# Patient Record
Sex: Female | Born: 1955 | Race: White | Hispanic: No | Smoking: Former smoker
Health system: Southern US, Community
[De-identification: ages and names within clinical notes are randomized; demographics above are authoritative.]

## PROBLEM LIST (undated history)

## (undated) DIAGNOSIS — E7800 Pure hypercholesterolemia, unspecified: Secondary | ICD-10-CM

## (undated) DIAGNOSIS — K760 Fatty (change of) liver, not elsewhere classified: Secondary | ICD-10-CM

## (undated) DIAGNOSIS — F419 Anxiety disorder, unspecified: Secondary | ICD-10-CM

## (undated) DIAGNOSIS — I739 Peripheral vascular disease, unspecified: Secondary | ICD-10-CM

## (undated) DIAGNOSIS — E039 Hypothyroidism, unspecified: Secondary | ICD-10-CM

## (undated) DIAGNOSIS — N183 Chronic kidney disease, stage 3 unspecified: Secondary | ICD-10-CM

## (undated) DIAGNOSIS — Z86718 Personal history of other venous thrombosis and embolism: Secondary | ICD-10-CM

## (undated) DIAGNOSIS — J449 Chronic obstructive pulmonary disease, unspecified: Secondary | ICD-10-CM

## (undated) DIAGNOSIS — M7989 Other specified soft tissue disorders: Secondary | ICD-10-CM

## (undated) DIAGNOSIS — K219 Gastro-esophageal reflux disease without esophagitis: Secondary | ICD-10-CM

## (undated) DIAGNOSIS — G43909 Migraine, unspecified, not intractable, without status migrainosus: Secondary | ICD-10-CM

## (undated) DIAGNOSIS — M797 Fibromyalgia: Secondary | ICD-10-CM

## (undated) DIAGNOSIS — J45909 Unspecified asthma, uncomplicated: Secondary | ICD-10-CM

## (undated) DIAGNOSIS — G9332 Myalgic encephalomyelitis/chronic fatigue syndrome: Secondary | ICD-10-CM

## (undated) DIAGNOSIS — R0602 Shortness of breath: Secondary | ICD-10-CM

## (undated) DIAGNOSIS — T7840XA Allergy, unspecified, initial encounter: Secondary | ICD-10-CM

## (undated) DIAGNOSIS — M2550 Pain in unspecified joint: Secondary | ICD-10-CM

## (undated) DIAGNOSIS — R7303 Prediabetes: Secondary | ICD-10-CM

## (undated) DIAGNOSIS — I10 Essential (primary) hypertension: Secondary | ICD-10-CM

## (undated) DIAGNOSIS — K5900 Constipation, unspecified: Secondary | ICD-10-CM

## (undated) DIAGNOSIS — E669 Obesity, unspecified: Secondary | ICD-10-CM

## (undated) HISTORY — DX: Chronic kidney disease, stage 3 unspecified: N18.30

## (undated) HISTORY — DX: Hypothyroidism, unspecified: E03.9

## (undated) HISTORY — DX: Shortness of breath: R06.02

## (undated) HISTORY — DX: Peripheral vascular disease, unspecified: I73.9

## (undated) HISTORY — DX: Anxiety disorder, unspecified: F41.9

## (undated) HISTORY — DX: Personal history of other venous thrombosis and embolism: Z86.718

## (undated) HISTORY — DX: Pain in unspecified joint: M25.50

## (undated) HISTORY — DX: Migraine, unspecified, not intractable, without status migrainosus: G43.909

## (undated) HISTORY — DX: Chronic obstructive pulmonary disease, unspecified: J44.9

## (undated) HISTORY — DX: Unspecified asthma, uncomplicated: J45.909

## (undated) HISTORY — DX: Obesity, unspecified: E66.9

## (undated) HISTORY — DX: Pure hypercholesterolemia, unspecified: E78.00

## (undated) HISTORY — DX: Fibromyalgia: M79.7

## (undated) HISTORY — DX: Fatty (change of) liver, not elsewhere classified: K76.0

## (undated) HISTORY — DX: Prediabetes: R73.03

## (undated) HISTORY — DX: Gastro-esophageal reflux disease without esophagitis: K21.9

## (undated) HISTORY — DX: Other specified soft tissue disorders: M79.89

## (undated) HISTORY — DX: Myalgic encephalomyelitis/chronic fatigue syndrome: G93.32

## (undated) HISTORY — DX: Allergy, unspecified, initial encounter: T78.40XA

## (undated) HISTORY — DX: Essential (primary) hypertension: I10

## (undated) HISTORY — DX: Constipation, unspecified: K59.00

---

## 1989-12-23 HISTORY — PX: OTHER SURGICAL HISTORY: SHX169

## 2007-12-24 HISTORY — PX: WRIST SURGERY: SHX841

## 2019-12-30 ENCOUNTER — Other Ambulatory Visit (HOSPITAL_COMMUNITY): Payer: Self-pay | Admitting: Family

## 2019-12-30 ENCOUNTER — Other Ambulatory Visit: Payer: Self-pay | Admitting: Family

## 2019-12-30 DIAGNOSIS — G35 Multiple sclerosis: Secondary | ICD-10-CM

## 2020-01-11 ENCOUNTER — Ambulatory Visit (HOSPITAL_COMMUNITY): Payer: Medicare HMO

## 2020-01-11 ENCOUNTER — Encounter (HOSPITAL_COMMUNITY): Payer: Self-pay

## 2020-01-20 ENCOUNTER — Ambulatory Visit: Payer: Medicare HMO | Admitting: Neurology

## 2020-01-26 ENCOUNTER — Other Ambulatory Visit: Payer: Self-pay | Admitting: Family

## 2020-01-26 ENCOUNTER — Other Ambulatory Visit: Payer: Self-pay

## 2020-01-26 ENCOUNTER — Ambulatory Visit
Admission: RE | Admit: 2020-01-26 | Discharge: 2020-01-26 | Disposition: A | Discharge status: Home / Self Care | Payer: Medicare HMO | Source: Ambulatory Visit | Attending: Family | Admitting: Family

## 2020-01-26 DIAGNOSIS — R52 Pain, unspecified: Secondary | ICD-10-CM

## 2020-08-02 ENCOUNTER — Other Ambulatory Visit: Payer: Self-pay | Admitting: Internal Medicine

## 2020-08-02 DIAGNOSIS — F17210 Nicotine dependence, cigarettes, uncomplicated: Secondary | ICD-10-CM

## 2020-08-15 ENCOUNTER — Other Ambulatory Visit: Payer: Self-pay

## 2020-08-15 ENCOUNTER — Ambulatory Visit
Admission: RE | Admit: 2020-08-15 | Discharge: 2020-08-15 | Disposition: A | Discharge status: Home / Self Care | Payer: Medicare Other | Source: Ambulatory Visit | Attending: Internal Medicine | Admitting: Internal Medicine

## 2020-08-15 DIAGNOSIS — F17210 Nicotine dependence, cigarettes, uncomplicated: Secondary | ICD-10-CM

## 2020-08-22 ENCOUNTER — Other Ambulatory Visit: Payer: Self-pay | Admitting: Internal Medicine

## 2020-08-22 DIAGNOSIS — Z1231 Encounter for screening mammogram for malignant neoplasm of breast: Secondary | ICD-10-CM

## 2020-08-22 DIAGNOSIS — G35 Multiple sclerosis: Secondary | ICD-10-CM

## 2020-08-23 ENCOUNTER — Ambulatory Visit: Payer: Self-pay

## 2020-08-23 ENCOUNTER — Encounter: Payer: Self-pay | Admitting: Orthopaedic Surgery

## 2020-08-23 ENCOUNTER — Ambulatory Visit (INDEPENDENT_AMBULATORY_CARE_PROVIDER_SITE_OTHER): Payer: Medicare Other | Admitting: Orthopaedic Surgery

## 2020-08-23 VITALS — BP 117/70 | HR 66 | Ht 65.0 in | Wt 194.0 lb

## 2020-08-23 DIAGNOSIS — M545 Low back pain: Secondary | ICD-10-CM | POA: Diagnosis not present

## 2020-08-23 DIAGNOSIS — M549 Dorsalgia, unspecified: Secondary | ICD-10-CM | POA: Diagnosis not present

## 2020-08-23 DIAGNOSIS — M7062 Trochanteric bursitis, left hip: Secondary | ICD-10-CM | POA: Diagnosis not present

## 2020-08-23 DIAGNOSIS — G8929 Other chronic pain: Secondary | ICD-10-CM | POA: Diagnosis not present
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
## 2020-08-29 MED ORDER — METHYLPREDNISOLONE ACETATE 40 MG/ML IJ SUSP
40.0000 mg | INTRAMUSCULAR | Status: AC | PRN
  Administered 2020-08-23: 40 mg via INTRA_ARTICULAR

## 2020-08-29 MED ORDER — BUPIVACAINE HCL 0.25 % IJ SOLN
2.0000 mL | INTRAMUSCULAR | Status: AC | PRN
  Administered 2020-08-23: 2 mL via INTRA_ARTICULAR

## 2020-08-29 MED ORDER — LIDOCAINE HCL 1 % IJ SOLN
0.5000 mL | INTRAMUSCULAR | Status: AC | PRN
  Administered 2020-08-23: .5 mL

## 2020-09-01 ENCOUNTER — Ambulatory Visit: Payer: Medicare Other

## 2020-09-11 ENCOUNTER — Ambulatory Visit
Admission: RE | Admit: 2020-09-11 | Discharge: 2020-09-11 | Disposition: A | Discharge status: Home / Self Care | Payer: Medicare Other | Source: Ambulatory Visit | Attending: Internal Medicine | Admitting: Internal Medicine

## 2020-09-11 ENCOUNTER — Other Ambulatory Visit: Payer: Self-pay | Admitting: Internal Medicine

## 2020-09-11 DIAGNOSIS — G35 Multiple sclerosis: Secondary | ICD-10-CM

## 2020-09-11 MED ORDER — GADOBENATE DIMEGLUMINE 529 MG/ML IV SOLN
19.0000 mL | Freq: Once | INTRAVENOUS | Status: AC | PRN
  Administered 2020-09-11: 19 mL via INTRAVENOUS

## 2020-09-30 ENCOUNTER — Ambulatory Visit
Admission: RE | Admit: 2020-09-30 | Discharge: 2020-09-30 | Disposition: A | Discharge status: Home / Self Care | Payer: Medicare Other | Source: Ambulatory Visit | Attending: Internal Medicine | Admitting: Internal Medicine

## 2020-09-30 DIAGNOSIS — G35 Multiple sclerosis: Secondary | ICD-10-CM

## 2020-09-30 MED ORDER — GADOBENATE DIMEGLUMINE 529 MG/ML IV SOLN
20.0000 mL | Freq: Once | INTRAVENOUS | Status: AC | PRN
  Administered 2020-09-30: 20 mL via INTRAVENOUS

## 2020-12-11 ENCOUNTER — Ambulatory Visit (INDEPENDENT_AMBULATORY_CARE_PROVIDER_SITE_OTHER): Payer: Medicare Other | Admitting: Neurology

## 2020-12-11 ENCOUNTER — Encounter: Payer: Self-pay | Admitting: Neurology

## 2020-12-11 VITALS — BP 159/73 | HR 61 | Ht 65.0 in | Wt 208.0 lb

## 2020-12-11 DIAGNOSIS — I10 Essential (primary) hypertension: Secondary | ICD-10-CM

## 2020-12-11 DIAGNOSIS — R9082 White matter disease, unspecified: Secondary | ICD-10-CM

## 2020-12-11 DIAGNOSIS — R208 Other disturbances of skin sensation: Secondary | ICD-10-CM | POA: Diagnosis not present

## 2020-12-11 DIAGNOSIS — Z720 Tobacco use: Secondary | ICD-10-CM

## 2020-12-11 MED ORDER — GABAPENTIN 600 MG PO TABS
600.0000 mg | ORAL_TABLET | Freq: Three times a day (TID) | ORAL | 11 refills | Status: DC
Start: 2020-12-11 — End: 2021-12-18
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
## 2021-01-02 ENCOUNTER — Telehealth: Payer: Self-pay | Admitting: *Deleted

## 2021-01-02 NOTE — Telephone Encounter (Signed)
R/c 3 cd from [facility] cd on [place] desk

## 2021-01-03 NOTE — Progress Notes (Signed)
Patient referred by Sonia Side., FNP for coronary artery disease  Subjective:   Monica Park, female    DOB: [birth year], 65 y.o.   MRN: [id number]   Chief Complaint  Patient presents with  . Coronary Artery Disease    New pat    . New Patient (Initial Visit)     HPI  65 y.o. Caucasian female with hypertension, hyperlipidemia, hypothyroidism, multiple sclerosis, referred for management of CAD.  Three vessel coronary atherosclerosis was noted on lung cancer screening CT chest in 07/2020. She moved from [place] to be close to her ailing stepfather, in 2020. She has minimal mobility limitations from a probable diagnosis of multiple sclerosis. She walks 5,000-10,000 steps/day. She denies chest pain, shortness of breath, palpitations, leg edema, orthopnea, PND, TIA/syncope. She does endorse pain in her left calf on walking, gets better with rest.   She is currently smoking 6-8 cigarettes/day, down from 1 pack/day. She is hoping to quit soon on her own.   Past Medical History:  Diagnosis Date  . Anxiety   . Hypertension   . Migraine      Past Surgical History:  Procedure Laterality Date  . [place]  . OTHER SURGICAL HISTORY  1991   nerve block, right leg  . WRIST SURGERY Left 2009   left arm/wrist     Social History   Tobacco Use  Smoking Status Current Every Day Smoker  . Packs/day: 0.50  Smokeless Tobacco Never Used    Social History   Substance and Sexual Activity  Alcohol Use Not Currently     Family History  Problem Relation Age of Onset  . Parkinson's disease Mother   . Cervical cancer Mother   . Dementia Father   . Lung disease Father   . Rheum arthritis Sister   . Thyroid cancer Sister   . Sarcoidosis Sister   . Heart Problems Brother      Current Outpatient Medications on File Prior to Visit  Medication Sig Dispense Refill  . albuterol (VENTOLIN HFA) 108 (90 Base) MCG/ACT inhaler Inhale 2 puffs into the  lungs every 4 (four) hours.    Marland Kitchen BIOTIN PO Take 10,000 mcg by mouth daily.    . budesonide-formoterol (SYMBICORT) 160-4.5 MCG/ACT inhaler Inhale 2 puffs into the lungs 2 (two) times daily.    . cetirizine (ZYRTEC) 10 MG tablet Take 10 mg by mouth daily.    . Cholecalciferol (VITAMIN D3 PO) Take 50,000 Units by mouth once a week.    . cyclobenzaprine (FLEXERIL) 10 MG tablet Take 10 mg by mouth 3 (three) times daily. (Patient not taking: Reported on 12/11/2020)    . gabapentin (NEURONTIN) 600 MG tablet Take 1 tablet (600 mg total) by mouth 3 (three) times daily. 90 tablet 11  . L-FORMULA LYSINE HCL PO Take 1,000 mg by mouth as needed.    Marland Kitchen levothyroxine (SYNTHROID) 25 MCG tablet Take 25 mcg by mouth daily.    Marland Kitchen losartan (COZAAR) 50 MG tablet Take 50 mg by mouth daily.    . montelukast (SINGULAIR) 10 MG tablet Take 10 mg by mouth daily.    Marland Kitchen oxyCODONE-acetaminophen (PERCOCET) 10-325 MG tablet SMARTSIG:1 Tablet(s) By Mouth Every 5-6 Hours PRN (Patient not taking: Reported on 12/11/2020)    . pantoprazole (PROTONIX) 40 MG tablet Take 40 mg by mouth daily.    . Probiotic Product (HEALTHY COLON PO) Take 1 Dose by mouth daily.     No  current facility-administered medications on file prior to visit.    Cardiovascular and other pertinent studies:  EKG 01/04/2021: Sinus rhythm 56 bpm Cannot exclude old anteroseptal infarct  CT Chest 08/15/2020: 1. Lung-RADS 2, benign appearance or behavior. Continue annual screening with low-dose chest CT without contrast in 12 months. 2. Three-vessel coronary atherosclerosis. 3. Aortic Atherosclerosis (ICD10-I70.0) and Emphysema (ICD10-J43.9).  Recent labs: 07/27/2020, 11/03/2020 Glucose 83, BUN/Cr 16/1.25. EGFR 45. Na/K 142/5.2 . Rest of the CMP normal H/H 12.9/39.7. MCV 95.4. Platelets 182 HbA1C N/A Chol 241, TG 123, HDL 51, LDL 165 TSH 1.79 normal   Review of Systems  Cardiovascular: Positive for claudication and dyspnea on exertion (Mild, stable).  Negative for chest pain, leg swelling, palpitations and syncope.         Vitals:   01/04/21 1049  BP: 140/64  Pulse: (!) 52  Resp: 16  Temp: 98.2 F (36.8 C)  SpO2: 96%     Body mass index is 34.28 kg/m. Filed Weights   01/04/21 1049  Weight: 206 lb (93.4 kg)     Objective:   Physical Exam Vitals and nursing note reviewed.  Constitutional:      General: She is not in acute distress. Neck:     Vascular: No JVD.  Cardiovascular:     Rate and Rhythm: Normal rate and regular rhythm.     Pulses:          Radial pulses are 2+ on the right side and 2+ on the left side.       Femoral pulses are 1+ on the right side and 0 on the left side.      Dorsalis pedis pulses are 1+ on the right side and 0 on the left side.       Posterior tibial pulses are 1+ on the right side and 0 on the left side.     Heart sounds: Normal heart sounds. No murmur heard.   Pulmonary:     Effort: Pulmonary effort is normal.     Breath sounds: Normal breath sounds. No wheezing or rales.         Assessment & Recommendations:   65 y.o. Caucasian female with hypertension, hyperlipidemia, hypothyroidism, multiple sclerosis, CAD, PAD  1. Coronary artery disease involving native coronary artery of native heart without angina pectoris Seen on CT chest 07/2020 No angina symptoms. Mild exertional dyspnea likely related to tobacco dependence. Recommend risk factor modification. Continue Aspirin 81 mg. Did not tolerate atorvastatin before. Recommend rosuvastatin 20 mg daily.  Discussed heart healthy diet and lifestyle.   2. PAD (peripheral artery disease) (HCC) Non-limiting claudication in left leg. Suspect Left SFA disease. Continue risk factor modification, as above. Added Pletal 50 mg bid Encouraged regular walking.  3. Mixed hyperlipidemia As above Repeat lipid panel in 3 months  F/u in 3 months   Thank you for referring the patient to [place]. Please feel free to contact with any  questions.   Nigel Mormon, MD Pager: [phone] Office: [phone]

## 2021-01-04 ENCOUNTER — Other Ambulatory Visit: Payer: Self-pay

## 2021-01-04 ENCOUNTER — Ambulatory Visit: Payer: Medicare Other | Admitting: Cardiology

## 2021-01-04 ENCOUNTER — Encounter: Payer: Self-pay | Admitting: Cardiology

## 2021-01-04 VITALS — BP 140/64 | HR 52 | Temp 98.2°F | Resp 16 | Ht 65.0 in | Wt 206.0 lb

## 2021-01-04 DIAGNOSIS — I2510 Atherosclerotic heart disease of native coronary artery without angina pectoris: Secondary | ICD-10-CM

## 2021-01-04 DIAGNOSIS — E782 Mixed hyperlipidemia: Secondary | ICD-10-CM

## 2021-01-04 DIAGNOSIS — I739 Peripheral vascular disease, unspecified: Secondary | ICD-10-CM | POA: Insufficient documentation

## 2021-01-04 MED ORDER — ROSUVASTATIN CALCIUM 20 MG PO TABS: 20.0000 mg | ORAL_TABLET | Freq: Every day | ORAL | 3 refills | Status: DC

## 2021-01-04 MED ORDER — CILOSTAZOL 50 MG PO TABS: 50.0000 mg | ORAL_TABLET | Freq: Two times a day (BID) | ORAL | 3 refills | Status: DC

## 2021-01-11 ENCOUNTER — Other Ambulatory Visit: Payer: Self-pay | Admitting: Cardiology

## 2021-01-11 DIAGNOSIS — I739 Peripheral vascular disease, unspecified: Secondary | ICD-10-CM

## 2021-01-11 DIAGNOSIS — I2510 Atherosclerotic heart disease of native coronary artery without angina pectoris: Secondary | ICD-10-CM

## 2021-02-07 ENCOUNTER — Ambulatory Visit: Payer: Medicare Other

## 2021-02-07 ENCOUNTER — Other Ambulatory Visit: Payer: Self-pay

## 2021-02-07 DIAGNOSIS — I739 Peripheral vascular disease, unspecified: Secondary | ICD-10-CM

## 2021-02-07 DIAGNOSIS — I2510 Atherosclerotic heart disease of native coronary artery without angina pectoris: Secondary | ICD-10-CM

## 2021-03-15 ENCOUNTER — Other Ambulatory Visit: Payer: Self-pay | Admitting: Cardiology

## 2021-03-15 DIAGNOSIS — R0989 Other specified symptoms and signs involving the circulatory and respiratory systems: Secondary | ICD-10-CM

## 2021-03-27 ENCOUNTER — Other Ambulatory Visit: Payer: Self-pay

## 2021-03-27 ENCOUNTER — Ambulatory Visit: Payer: Medicare Other

## 2021-03-27 DIAGNOSIS — I6523 Occlusion and stenosis of bilateral carotid arteries: Secondary | ICD-10-CM

## 2021-03-27 DIAGNOSIS — R0989 Other specified symptoms and signs involving the circulatory and respiratory systems: Secondary | ICD-10-CM

## 2021-04-01 ENCOUNTER — Other Ambulatory Visit: Payer: Self-pay | Admitting: Cardiology

## 2021-04-01 DIAGNOSIS — I739 Peripheral vascular disease, unspecified: Secondary | ICD-10-CM

## 2021-04-04 ENCOUNTER — Ambulatory Visit: Payer: Medicare Other | Admitting: Cardiology

## 2021-04-04 ENCOUNTER — Encounter: Payer: Self-pay | Admitting: Cardiology

## 2021-04-04 ENCOUNTER — Other Ambulatory Visit: Payer: Self-pay

## 2021-04-04 VITALS — BP 104/51 | HR 83 | Temp 98.0°F | Resp 16 | Ht 65.0 in | Wt 212.0 lb

## 2021-04-04 DIAGNOSIS — F17210 Nicotine dependence, cigarettes, uncomplicated: Secondary | ICD-10-CM | POA: Insufficient documentation

## 2021-04-04 DIAGNOSIS — I2510 Atherosclerotic heart disease of native coronary artery without angina pectoris: Secondary | ICD-10-CM

## 2021-04-04 DIAGNOSIS — I6523 Occlusion and stenosis of bilateral carotid arteries: Secondary | ICD-10-CM

## 2021-04-04 DIAGNOSIS — E782 Mixed hyperlipidemia: Secondary | ICD-10-CM

## 2021-04-04 DIAGNOSIS — R0989 Other specified symptoms and signs involving the circulatory and respiratory systems: Secondary | ICD-10-CM | POA: Insufficient documentation

## 2021-04-04 DIAGNOSIS — I739 Peripheral vascular disease, unspecified: Secondary | ICD-10-CM

## 2021-04-04 MED ORDER — ATORVASTATIN CALCIUM 20 MG PO TABS: 20.0000 mg | ORAL_TABLET | Freq: Every day | ORAL | 3 refills | Status: DC

## 2021-04-04 NOTE — Progress Notes (Signed)
Patient referred by Dr. Jilda Panda for coronary artery disease, carotid bruit  Subjective:   Monica Park, female    DOB: [birth year], 65 y.o.   MRN: [id number]   Chief Complaint  Patient presents with  . Coronary Artery Disease  . PAD  . Follow-up    3 month  . Results    duplex     HPI  66 y.o. Caucasian female with hypertension, hyperlipidemia, hypothyroidism, multiple sclerosis, referred for management of CAD, carotid bruit  Reviewed recent test results with the patient, deails below. Patients claudication symptoms have improved on cilostazol. She had R>L leg cramps with rosuvastatin.   Initial consultation HPI 02/2021: Three vessel coronary atherosclerosis was noted on lung cancer screening CT chest in 07/2020. She moved from [place] to be close to her ailing stepfather, in 2020. She has minimal mobility limitations from a probable diagnosis of multiple sclerosis. She walks 5,000-10,000 steps/day. She denies chest pain, shortness of breath, palpitations, leg edema, orthopnea, PND, TIA/syncope. She does endorse pain in her left calf on walking, gets better with rest.   She is currently smoking 6-8 cigarettes/day, down from 1 pack/day. She is hoping to quit soon on her own.     Current Outpatient Medications on File Prior to Visit  Medication Sig Dispense Refill  . albuterol (VENTOLIN HFA) 108 (90 Base) MCG/ACT inhaler Inhale 2 puffs into the lungs every 4 (four) hours.    Marland Kitchen BIOTIN PO Take 10,000 mcg by mouth daily.    . budesonide-formoterol (SYMBICORT) 160-4.5 MCG/ACT inhaler Inhale 2 puffs into the lungs 2 (two) times daily.    . cetirizine (ZYRTEC) 10 MG tablet Take 10 mg by mouth daily.    . Cholecalciferol (VITAMIN D3 PO) Take 50,000 Units by mouth once a week.    . cilostazol (PLETAL) 50 MG tablet TAKE 1 TABLET(50 MG) BY MOUTH TWICE DAILY 180 tablet 0  . gabapentin (NEURONTIN) 600 MG tablet Take 1 tablet (600 mg total) by mouth 3 (three) times daily. 90 tablet 11   . levothyroxine (SYNTHROID) 25 MCG tablet Take 25 mcg by mouth daily.    Marland Kitchen losartan (COZAAR) 50 MG tablet Take 50 mg by mouth daily.    . montelukast (SINGULAIR) 10 MG tablet Take 10 mg by mouth daily.    . pantoprazole (PROTONIX) 40 MG tablet Take 40 mg by mouth daily.    . Probiotic Product (HEALTHY COLON PO) Take 1 Dose by mouth daily.     No current facility-administered medications on file prior to visit.    Cardiovascular and other pertinent studies:  Carotid duplex 03/27/2021 (Preliminary report): Doppler velocity suggests stenosis in the right internal carotid artery (50-69%). Doppler velocity suggests stenosis in the left internal carotid artery (50-69%). Doppler velocity suggests stenosis in the left external carotid artery (<50%). Antegrade right vertebral artery flow. Antegrade left vertebral artery flow. Follow up in six months is appropriate if clinically indicated.   ABI 02/07/2021:  This exam reveals mildly decreased perfusion of the right lower extremity,  noted at the post tibial artery level (ABI 0.92) and moderately decreased  perfusion of the left lower extremity, noted at the anterior tibial and  post tibial artery level (ABI 0.62).  Consider complete lower extremity arterial duplex if clinically indicated.   Echocardiogram 02/07/2021:  1. Normal LV systolic function with visual EF 55-60%. Left ventricle  cavity is normal in size. Mild left ventricular hypertrophy. Normal global  wall motion. Indeterminate diastolic filling pattern, normal LAP.  2. Left atrial cavity is mildly dilated.  3. Mild (Grade I) mitral regurgitation.  4. IVC is dilated with respiratory variation.  5. No prior study for comparison.  EKG 01/04/2021: Sinus rhythm 56 bpm Cannot exclude old anteroseptal infarct  CT Chest 08/15/2020: 1. Lung-RADS 2, benign appearance or behavior. Continue annual screening with low-dose chest CT without contrast in 12 months. 2. Three-vessel coronary  atherosclerosis. 3. Aortic Atherosclerosis (ICD10-I70.0) and Emphysema (ICD10-J43.9).  Recent labs: 07/27/2020, 11/03/2020 Glucose 83, BUN/Cr 16/1.25. EGFR 45. Na/K 142/5.2 . Rest of the CMP normal H/H 12.9/39.7. MCV 95.4. Platelets 182 HbA1C N/A Chol 241, TG 123, HDL 51, LDL 165 TSH 1.79 normal   Review of Systems  Cardiovascular: Positive for claudication and dyspnea on exertion (Mild, stable). Negative for chest pain, leg swelling, palpitations and syncope.         Vitals:   04/04/21 1355  BP: (!) 104/51  Pulse: 83  Resp: 16  Temp: 98 F (36.7 C)  SpO2: 98%     Body mass index is 35.28 kg/m. Filed Weights   04/04/21 1355  Weight: 212 lb (96.2 kg)     Objective:   Physical Exam Vitals and nursing note reviewed.  Constitutional:      General: She is not in acute distress. Neck:     Vascular: No JVD.  Cardiovascular:     Rate and Rhythm: Normal rate and regular rhythm.     Pulses:          Carotid pulses are on the right side with bruit and on the left side with bruit.      Radial pulses are 2+ on the right side and 2+ on the left side.       Femoral pulses are 1+ on the right side and 0 on the left side.      Dorsalis pedis pulses are 1+ on the right side and 0 on the left side.       Posterior tibial pulses are 1+ on the right side and 0 on the left side.     Heart sounds: Normal heart sounds. No murmur heard.   Pulmonary:     Effort: Pulmonary effort is normal.     Breath sounds: Normal breath sounds. No wheezing or rales.         Assessment & Recommendations:   65 y.o. Caucasian female with hypertension, hyperlipidemia, hypothyroidism, multiple sclerosis, CAD, PAD, b/l mod carotid stenosis  Coronary artery disease involving native coronary artery of native heart without angina pectoris Seen on CT chest 07/2020 No angina symptoms. Mild exertional dyspnea likely related to tobacco dependence. Recommend risk factor modification. Continue  Aspirin 81 mg. Did not tolerate atorvastatin before. Did not tolerate rosuvastatin 20 mg either, due to R?L leg cramps (PAD is worse in left leg). Not wanting to try PCSK9 inhibitor, but willing to try Lipitor 20 mg again. Repeat lipid panel in 1 month Discussed heart healthy diet and lifestyle.   PAD (peripheral artery disease) (HCC) Rt ABI 0.82, Lt ABI 0.62. Claudication improved. Cotinue risk factor modification Encouraged regular walking.  Carotid artery disease: Asymptomatic, moderate, bilateral Repeat in 6 months  Mixed hyperlipidemia As above  Nicotine dependence: Down to 10 cigarettes from 2 packs/day. Re-emphasized importance of cessation. Wants to quit on her own.  F/u in 4 weeks    Nigel Mormon, MD Pager: [phone] Office: [phone]

## 2021-04-11 ENCOUNTER — Ambulatory Visit: Payer: Medicare Other | Admitting: Neurology

## 2021-05-03 ENCOUNTER — Telehealth: Payer: Self-pay | Admitting: Neurology

## 2021-05-03 ENCOUNTER — Ambulatory Visit (INDEPENDENT_AMBULATORY_CARE_PROVIDER_SITE_OTHER): Payer: Medicare Other | Admitting: Neurology

## 2021-05-03 ENCOUNTER — Encounter: Payer: Self-pay | Admitting: Neurology

## 2021-05-03 VITALS — BP 155/73 | HR 73 | Ht 65.0 in | Wt 213.0 lb

## 2021-05-03 DIAGNOSIS — R9082 White matter disease, unspecified: Secondary | ICD-10-CM | POA: Diagnosis not present

## 2021-05-03 DIAGNOSIS — Z720 Tobacco use: Secondary | ICD-10-CM

## 2021-05-03 DIAGNOSIS — M4807 Spinal stenosis, lumbosacral region: Secondary | ICD-10-CM

## 2021-05-03 DIAGNOSIS — M5416 Radiculopathy, lumbar region: Secondary | ICD-10-CM

## 2021-05-03 DIAGNOSIS — I10 Essential (primary) hypertension: Secondary | ICD-10-CM

## 2021-05-03 DIAGNOSIS — R208 Other disturbances of skin sensation: Secondary | ICD-10-CM | POA: Diagnosis not present

## 2021-05-03 MED ORDER — BACLOFEN 10 MG PO TABS: ORAL_TABLET | ORAL | 5 refills | Status: DC

## 2021-05-03 NOTE — Progress Notes (Signed)
[facility]  PATIENT: Monica Park DOB: May 04, 1956  REFERRING DOCTOR OR PCP: Ralene Ok, MD SOURCE: Patient, notes from primary care, imaging and laboratory reports, MRI images personally reviewed.  _________________________________   HISTORICAL  CHIEF COMPLAINT:  Chief Complaint  Patient presents with  . Follow-up    RM 13, alone. Last seen 12/11/2020. Doing well, no new sx.    HISTORY OF PRESENT ILLNESS:  Monica Park is a 65 y.o. woman with leg pain, dysesthesias and the possibility of multiple sclerosis.  Update 05/03/2021: She reports burning dysesthesias in her feet and sciatic type pain in her legs.  She has a little back pain.   When she walks longer distance he gets numbness in her feet..   Pain started many years ago and has progressively worsened.   Gabapentin 600 mg po bid helps her some  If she walks a few minutes but then needs to stop due to the pain.    Due to her renal disease, we can use an NSAID.  She has no recent falls since the last visit..   She has urinary urgency and frequency with no incontinence.   Vision is usually fine but is worse when she has a headache   We had a conversation about the MRI images that I personally reviewed today and compared to previous images and possibility of MS.  I feel there is about a 25% possibility.  CSF analysis could help [place] evaluate but she is reluctant to do a lumbar puncture.  She has not had any classic exacerbations.  She sleeps well at night unless the stiffness is worse in her legs.  She sometimes feels fatigue and sometimes needs to force herself to do tasks. .She has some anxiety but not depression.  She has a lot of stress with her dad about to enter hospice (metastatic lung cancer).   She feels cognition is fine.       HISTORY:  She was diagnosed with MS in [place] at age 16 based on her symptoms and MRI.   She never had a lumbar puncture (and does not wan one).   At the time she had  stiffness in her legs and memory issues.    She was also experiencing headaches.    She was not placed on any DMTs.    Her main problem is leg pain . Tylenol helps a little bit.    Baclofen in the past helped more than flexeril.     Vascular risk factors:   She has been on medications for a few years.   She smokes 1/2 ppd now but used to smoke 2 ppd.     In 2007, she had an accident with LOC and was told she injured her brain.  She feels she lost old memories as well as more trouble forming new memory.     She has stage 3 CRF.        DATA REVIEWED: MRI of the brain 09/30/2020 shows multiple T2/FLAIR hyperintense foci in the white matter of both hemispheres.  There are no foci in the infratentorial white matter.  Most of th lesions are in the deep white matter.  Some foci are subcortical but there do not appear to be juxtacortical foci.  There are a few lesions that are in the periventricular white matter, radially oriented to the ventricles.  None of the foci enhance after contrast.  I compared to the 2015 MRI.  The overall pattern is similar though there  has been some progression with enlargement of multiple foci more consistent with chronic microvascular ischemic change.  There are a couple new foci that are small.  MRI of the cervical spine 09/11/2020 shows disc protrusions at C3-C4 through C6-C7 but no nerve root compression or spinal cord appears normal.  Normal enhancement pattern.  MRI of the lumbar spine 02/05/2008 showed minimal disc bulging at L4-L5 that does not cause spinal stenosis or nerve root compression.  At L5-S1, there is a small disc bulge, disc degradation and right greater than left facet hypertrophy.  There is no significant foraminal narrowing and only minimal lateral recess stenosis.  No nerve root compression or spinal stenosis.   MRI of the brain 10/19/2014 showed multiple T2/FLAIR hyperintense foci.  The pattern is mostly nonspecific with a few foci in the periventricular  white matter but most foci in the deep white matter or subcortical white matter. Recent lab work shows elevated creatinine with reduced GFR, normal blood count, normal lipid panel.  EKG showed bradycardia with a rate of 47.  REVIEW OF SYSTEMS: Constitutional: No fevers, chills, sweats, or change in appetite Eyes: No visual changes, double vision, eye pain Ear, nose and throat: No hearing loss, ear pain, nasal congestion, sore throat Cardiovascular: No chest pain, palpitations Respiratory: No shortness of breath at rest or with exertion.   No wheezes GastrointestinaI: No nausea, vomiting, diarrhea, abdominal pain, fecal incontinence Genitourinary: No dysuria, urinary retention or frequency.  No nocturia. Musculoskeletal: No neck pain, back pain Integumentary: No rash, pruritus, skin lesions Neurological: as above Psychiatric: No depression at this time.  No anxiety Endocrine: No palpitations, diaphoresis, change in appetite, change in weigh or increased thirst Hematologic/Lymphatic: No anemia, purpura, petechiae. Allergic/Immunologic: No itchy/runny eyes, nasal congestion, recent allergic reactions, rashes  ALLERGIES: Allergies  Allergen Reactions  . Aspirin   . Codeine   . Levaquin [Levofloxacin]   . Penicillins   . Toradol [Ketorolac Tromethamine]     HOME MEDICATIONS:  Current Outpatient Medications:  .  albuterol (VENTOLIN HFA) 108 (90 Base) MCG/ACT inhaler, Inhale 2 puffs into the lungs every 4 (four) hours., Disp: , Rfl:  .  baclofen (LIORESAL) 10 MG tablet, 1/2 to 1 mg po tid, Disp: 90 each, Rfl: 5 .  BIOTIN PO, Take 10,000 mcg by mouth daily., Disp: , Rfl:  .  budesonide-formoterol (SYMBICORT) 160-4.5 MCG/ACT inhaler, Inhale 2 puffs into the lungs 2 (two) times daily., Disp: , Rfl:  .  cetirizine (ZYRTEC) 10 MG tablet, Take 10 mg by mouth daily., Disp: , Rfl:  .  Cholecalciferol (VITAMIN D3 PO), Take 50,000 Units by mouth once a week., Disp: , Rfl:  .  cilostazol  (PLETAL) 50 MG tablet, TAKE 1 TABLET(50 MG) BY MOUTH TWICE DAILY, Disp: 180 tablet, Rfl: 0 .  gabapentin (NEURONTIN) 600 MG tablet, Take 1 tablet (600 mg total) by mouth 3 (three) times daily., Disp: 90 tablet, Rfl: 11 .  levothyroxine (SYNTHROID) 25 MCG tablet, Take 25 mcg by mouth daily., Disp: , Rfl:  .  losartan (COZAAR) 50 MG tablet, Take 50 mg by mouth daily., Disp: , Rfl:  .  montelukast (SINGULAIR) 10 MG tablet, Take 10 mg by mouth daily., Disp: , Rfl:  .  pantoprazole (PROTONIX) 40 MG tablet, Take 40 mg by mouth daily., Disp: , Rfl:  .  Probiotic Product (HEALTHY COLON PO), Take 1 Dose by mouth daily., Disp: , Rfl:   PAST MEDICAL HISTORY: Past Medical History:  Diagnosis Date  . Anxiety   .  Hypertension   . Migraine     PAST SURGICAL HISTORY: Past Surgical History:  Procedure Laterality Date  . CESAREAN SECTION     1976, 1979  . OTHER SURGICAL HISTORY  1991   nerve block, right leg  . WRIST SURGERY Left 2009   left arm/wrist    FAMILY HISTORY: Family History  Problem Relation Age of Onset  . Parkinson's disease Mother   . Cervical cancer Mother   . Dementia Father   . Lung disease Father   . Rheum arthritis Sister   . Thyroid cancer Sister   . Sarcoidosis Sister   . Heart Problems Brother     SOCIAL HISTORY:  Social History   Socioeconomic History  . Marital status: Divorced    Spouse name: Not on file  . Number of children: 2  . Years of education: 10  . Highest education level: Not on file  Occupational History  . Occupation: [employer]  Tobacco Use  . Smoking status: Current Every Day Smoker    Packs/day: 0.50  . Smokeless tobacco: Never Used  Substance and Sexual Activity  . Alcohol use: Not Currently  . Drug use: Never  . Sexual activity: Not on file  Other Topics Concern  . Not on file  Social History Narrative   Lives with sister, Patty    Right handed   Caffeine use: coffee twice daily   Social Determinants of Health   Financial Resource  Strain: Not on file  Food Insecurity: Not on file  Transportation Needs: Not on file  Physical Activity: Not on file  Stress: Not on file  Social Connections: Not on file  Intimate Partner Violence: Not on file     PHYSICAL EXAM  Vitals:   05/03/21 0831  BP: (!) 155/73  Pulse: 73  Weight: 213 lb (96.6 kg)  Height: 5\' 5"  (1.651 m)    Body mass index is 35.45 kg/m.   General: The patient is well-developed and well-nourished and in no acute distress  HEENT:  Head is [place]/AT.  Sclera are anicteric.  Funduscopic exam shows normal optic discs and retinal vessels.  Neck: No carotid bruits are noted.  The neck is nontender.  Cardiovascular: The heart has a regular rate and rhythm with a normal S1 and S2. There were no murmurs, gallops or rubs.    Skin: Extremities are without rash or  edema.  Musculoskeletal:  Back is nontender  Neurologic Exam  Mental status: The patient is alert and oriented x 3 at the time of the examination. The patient has reduced short term memory (1/3 spontaneous, 2/3 with category prompt), with an apparently normal attention span and concentration ability.   Speech is normal.  Cranial nerves: Extraocular movements are full.  Facial strength and sensation was normal.  No dysarthria.  No obvious hearing deficits are noted.  Motor:  Muscle bulk is normal.   Tone is normal. Strength is  5 / 5 in all 4 extremities except 4+ EHL and intrinsic toe muscles  Sensory: Sensory testing is intact to pinprick, soft touch and vibration sensation in arms but mild reduced pinprick in left foot uo to ankle and reduced vibration to about 50% at the toes.  The ankles were fairly normal.   Coordination: Cerebellar testing reveals good finger-nose-finger and heel-to-shin bilaterally.  Gait and station: Station is normal.   Gait is mildly wide and mild reduced stride. Tandem gait is wide. Romberg is negative.   Reflexes: Deep tendon reflexes are symmetric and  normal in arms.    Plantar responses are flexor.     ASSESSMENT AND PLAN  White matter abnormality on MRI of brain  Spinal stenosis of lumbosacral region - Plan: MR LUMBAR SPINE WO CONTRAST  Dysesthesia  Tobacco abuse  Essential hypertension  Lumbar radiculopathy   1.  I reviewed her MRI images from 2015 and compared them to the more recent MRI.  There has been some progression but the pattern of progression is nonspecific.  I feel the likelihood she has MS is about 1/4.  The majority of the foci on her her brain MRI due to chronic microvascular ischemic changes, probably because of the hypertension and smoking.  We discussed smoking cessation but her father is entering hospice and this is not a time she feels comfortable trying to stop.   However, a few foci are periventricular, radially oriented to the ventricles and MS cannot be completely ruled out.  There were no enhancing lesions on either MRI.  We discussed CSF analysis might allow [place] to more accurately rule her in or out for MS but she would prefer not to do a lumbar puncture at this time. 2.   Baclofen 5-10 mg po tid 3.   MRI lumbar spine --- symptoms are c/w spinal stenosis and radiculopathy.  Based on the results we may need to refer for epidural steroid or surgery. 4.   She also has some depression.    She would like to hold on treatment --- consider Cymbalta as it may also help pain some.   Also consider PT - poor timing now as dad just entered hospice,   Se will call if she wants [place] to set up.   5.   rtc 5 months sooner if new or worsening issues.    50-minute office visit with the majority of the time spent face-to-face for history and physical, discussion/counseling and decision-making.  Additional time with record review, MRI image review and comparison and documentation.   Evann Erazo A. Epimenio Foot, MD, [facility] 05/03/2021, 10:31 AM Certified in Neurology, Clinical Neurophysiology, Sleep Medicine and Neuroimaging  [facility] [address] [place], [place] [address] [phone]

## 2021-05-03 NOTE — Telephone Encounter (Signed)
[employer] medicare/medicaid order sent to [facility]. No auth require they will reach out to the patient to schedule.  

## 2021-05-13 ENCOUNTER — Ambulatory Visit
Admission: RE | Admit: 2021-05-13 | Discharge: 2021-05-13 | Disposition: A | Discharge status: Home / Self Care | Payer: Medicare Other | Source: Ambulatory Visit | Attending: Neurology | Admitting: Neurology

## 2021-05-13 ENCOUNTER — Other Ambulatory Visit: Payer: Self-pay

## 2021-05-13 DIAGNOSIS — M4807 Spinal stenosis, lumbosacral region: Secondary | ICD-10-CM | POA: Diagnosis not present

## 2021-05-15 LAB — LIPID PANEL
Chol/HDL Ratio: 4.5 ratio — ABNORMAL HIGH (ref 0.0–4.4)
Cholesterol, Total: 220 mg/dL — ABNORMAL HIGH (ref 100–199)
HDL: 49 mg/dL (ref >39)
LDL Chol Calc (NIH): 149 mg/dL — ABNORMAL HIGH (ref 0–99)
Triglycerides: 124 mg/dL (ref 0–149)
VLDL Cholesterol Cal: 22 mg/dL (ref 5–40)

## 2021-05-16 DIAGNOSIS — I6523 Occlusion and stenosis of bilateral carotid arteries: Secondary | ICD-10-CM | POA: Insufficient documentation

## 2021-05-16 NOTE — Progress Notes (Signed)
Patient referred by Dr. Jilda Panda for coronary artery disease, carotid bruit  Subjective:   Monica Park, female    DOB: 02/16/1956, 65 y.o.   MRN: [id number]   Chief Complaint  Patient presents with  . Asymptomatic bilateral carotid artery stenosis  . Coronary Artery Disease  . Follow-up  . Results    lab     HPI  65 y.o. Caucasian female with hypertension, hyperlipidemia, hypothyroidism, multiple sclerosis, referred for management of CAD, carotid bruit  Patient did not tolerate atorvastatin or rosuvastatin due to myalgias. In addition, she also has bilateral calf claudication, as well as b/l feet pain.   Initial consultation HPI 02/2021: Three vessel coronary atherosclerosis was noted on lung cancer screening CT chest in 07/2020. She moved from [place] to be close to her ailing stepfather, in 2020. She has minimal mobility limitations from a probable diagnosis of multiple sclerosis. She walks 5,000-10,000 steps/day. She denies chest pain, shortness of breath, palpitations, leg edema, orthopnea, PND, TIA/syncope. She does endorse pain in her left calf on walking, gets better with rest.   She is currently smoking 6-8 cigarettes/day, down from 1 pack/day. She is hoping to quit soon on her own.     Current Outpatient Medications on File Prior to Visit  Medication Sig Dispense Refill  . albuterol (VENTOLIN HFA) 108 (90 Base) MCG/ACT inhaler Inhale 2 puffs into the lungs every 4 (four) hours.    . baclofen (LIORESAL) 10 MG tablet 1/2 to 1 mg po tid 90 each 5  . BIOTIN PO Take 10,000 mcg by mouth daily.    . budesonide-formoterol (SYMBICORT) 160-4.5 MCG/ACT inhaler Inhale 2 puffs into the lungs 2 (two) times daily.    . cetirizine (ZYRTEC) 10 MG tablet Take 10 mg by mouth daily.    . Cholecalciferol (VITAMIN D3 PO) Take 50,000 Units by mouth once a week.    . cilostazol (PLETAL) 50 MG tablet TAKE 1 TABLET(50 MG) BY MOUTH TWICE DAILY 180 tablet 0  . gabapentin (NEURONTIN) 600  MG tablet Take 1 tablet (600 mg total) by mouth 3 (three) times daily. 90 tablet 11  . levothyroxine (SYNTHROID) 25 MCG tablet Take 25 mcg by mouth daily.    Marland Kitchen losartan (COZAAR) 50 MG tablet Take 50 mg by mouth daily.    . montelukast (SINGULAIR) 10 MG tablet Take 10 mg by mouth daily.    . pantoprazole (PROTONIX) 40 MG tablet Take 40 mg by mouth daily.    . Probiotic Product (HEALTHY COLON PO) Take 1 Dose by mouth daily.    . Red Yeast Rice 600 MG CAPS Take 1 capsule by mouth daily.     No current facility-administered medications on file prior to visit.    Cardiovascular and other pertinent studies:  Carotid artery duplex 03/27/2021:  Stenosis in the right internal carotid artery (50-69%).  Stenosis in the left internal carotid artery (50-69%). Stenosis in the  left external carotid artery (<50%).  Antegrade right vertebral artery flow. Antegrade left vertebral artery  flow.  Follow up in six months is appropriate if clinically indicated.   ABI 02/07/2021:  This exam reveals mildly decreased perfusion of the right lower extremity,  noted at the post tibial artery level (ABI 0.92) and moderately decreased  perfusion of the left lower extremity, noted at the anterior tibial and  post tibial artery level (ABI 0.62).  Consider complete lower extremity arterial duplex if clinically indicated.   Echocardiogram 02/07/2021:  1. Normal LV systolic function  with visual EF 55-60%. Left ventricle  cavity is normal in size. Mild left ventricular hypertrophy. Normal global  wall motion. Indeterminate diastolic filling pattern, normal LAP.  2. Left atrial cavity is mildly dilated.  3. Mild (Grade I) mitral regurgitation.  4. IVC is dilated with respiratory variation.  5. No prior study for comparison.  EKG 01/04/2021: Sinus rhythm 56 bpm Cannot exclude old anteroseptal infarct  CT Chest 08/15/2020: 1. Lung-RADS 2, benign appearance or behavior. Continue annual screening with low-dose chest CT  without contrast in 12 months. 2. Three-vessel coronary atherosclerosis. 3. Aortic Atherosclerosis (ICD10-I70.0) and Emphysema (ICD10-J43.9).  Recent labs: 04/24/2021: Chol 220, TG 124, HDL 49, LDL 149  07/27/2020, 11/03/2020 Glucose 83, BUN/Cr 16/1.25. EGFR 45. Na/K 142/5.2 . Rest of the CMP normal H/H 12.9/39.7. MCV 95.4. Platelets 182 HbA1C N/A Chol 241, TG 123, HDL 51, LDL 165 TSH 1.79 normal   Review of Systems  Cardiovascular: Positive for claudication and dyspnea on exertion (Mild, stable). Negative for chest pain, leg swelling, palpitations and syncope.  Musculoskeletal:       B/l feet pain        Vitals:   05/17/21 1307  BP: 120/70  Pulse: (!) 105  Resp: 16  Temp: 97.6 F (36.4 C)  SpO2: 98%     Body mass index is 35.28 kg/m. Filed Weights   05/17/21 1307  Weight: 212 lb (96.2 kg)     Objective:   Physical Exam Vitals and nursing note reviewed.  Constitutional:      General: She is not in acute distress. Neck:     Vascular: No JVD.  Cardiovascular:     Rate and Rhythm: Normal rate and regular rhythm.     Pulses:          Carotid pulses are on the right side with bruit and on the left side with bruit.      Radial pulses are 2+ on the right side and 2+ on the left side.       Femoral pulses are 1+ on the right side and 0 on the left side.      Dorsalis pedis pulses are 1+ on the right side and 1+ on the left side.       Posterior tibial pulses are 0 on the right side and 0 on the left side.     Heart sounds: Normal heart sounds. No murmur heard.     Comments: Area superficial skin tear Rt foor 5th toe. No ulceration.  Poor nail hygiene Pulmonary:     Effort: Pulmonary effort is normal.     Breath sounds: Normal breath sounds. No wheezing or rales.  Musculoskeletal:     Right lower leg: No edema.     Left lower leg: No edema.  Feet:     Right foot:     Skin integrity: Dry skin present. No ulcer.     Toenail Condition: Right toenails are  abnormally thick. Fungal disease present.    Left foot:     Skin integrity: Dry skin present. No ulcer.     Toenail Condition: Left toenails are abnormally thick. Fungal disease present.       Media Information     Assessment & Recommendations:   65 y.o. Caucasian female with hypertension, hyperlipidemia, hypothyroidism, multiple sclerosis, CAD, PAD, b/l mod carotid stenosis  Coronary artery disease involving native coronary artery of native heart without angina pectoris Seen on CT chest 07/2020 No angina symptoms. Mild exertional dyspnea likely related to  tobacco dependence. Recommend risk factor modification. Continue Aspirin 81 mg. Did not tolerate atorvastatin or rosuvastatin due to myalgias. Currently taking red yeast extract. LDL down from 165 to 149. Not willing to try injectables.  Will try Nexlizet180-10 mg daily. Samples given.   PAD (peripheral artery disease) (HCC) Rt ABI 0.82, Lt ABI 0.62. Non-lifestlye limiting claudication. Continue Pletal 50 mg bid, added plavix 75 mg daily. She has an area of superficial skin tear on Rt foot 5th toe without any tissue loss. This needs to be monitored closely. I have also referred her to podiatry for nail care in patient with PAD.  Encouraged regular walking.  Carotid artery disease: Asymptomatic, moderate, bilateral Repeat in 6 months  Mixed hyperlipidemia As above  Nicotine dependence: Tobacco cessation counseling:  - Currently smoking 0.5 packs/day   - Patient was informed of the dangers of tobacco abuse including stroke, cancer, and MI, as well as benefits of tobacco cessation. - Patient is willing to quit at this time. - Approximately 5 mins were spent counseling patient cessation techniques. We discussed various methods to help quit smoking, including deciding on a date to quit, joining a support group, pharmacological agents. Patient would like to use nicotine patch. - I will reassess her progress at the next  follow-up visit  F/u in 2-3 weeks    Nigel Mormon, MD Pager: [phone] Office: [phone]

## 2021-05-17 ENCOUNTER — Ambulatory Visit: Payer: Medicare Other | Admitting: Cardiology

## 2021-05-17 ENCOUNTER — Encounter: Payer: Self-pay | Admitting: Cardiology

## 2021-05-17 ENCOUNTER — Other Ambulatory Visit: Payer: Self-pay

## 2021-05-17 VITALS — BP 120/70 | HR 105 | Temp 97.6°F | Resp 16 | Ht 65.0 in | Wt 212.0 lb

## 2021-05-17 DIAGNOSIS — E782 Mixed hyperlipidemia: Secondary | ICD-10-CM

## 2021-05-17 DIAGNOSIS — I6523 Occlusion and stenosis of bilateral carotid arteries: Secondary | ICD-10-CM

## 2021-05-17 DIAGNOSIS — F17210 Nicotine dependence, cigarettes, uncomplicated: Secondary | ICD-10-CM

## 2021-05-17 DIAGNOSIS — I2510 Atherosclerotic heart disease of native coronary artery without angina pectoris: Secondary | ICD-10-CM

## 2021-05-17 DIAGNOSIS — I739 Peripheral vascular disease, unspecified: Secondary | ICD-10-CM

## 2021-05-17 MED ORDER — CLOPIDOGREL BISULFATE 75 MG PO TABS: 75.0000 mg | ORAL_TABLET | Freq: Every day | ORAL | 3 refills | Status: DC

## 2021-05-17 MED ORDER — NEXLIZET 180-10 MG PO TABS: 1.0000 | ORAL_TABLET | Freq: Every day | ORAL | 3 refills | Status: DC

## 2021-05-17 MED ORDER — NICOTINE 14 MG/24HR TD PT24: 14.0000 mg | MEDICATED_PATCH | Freq: Every day | TRANSDERMAL | 2 refills | Status: DC

## 2021-05-18 ENCOUNTER — Telehealth: Payer: Self-pay | Admitting: Neurology

## 2021-05-18 NOTE — Telephone Encounter (Signed)
I called and got voicemail so left a message.  I was going to discuss the MRI results.  The most significant level is  "L4-L5: There is moderate spinal stenosis (AP diameter 7.3 mm) due to facet hypertrophy, ligamenta flava hypertrophy and broad disc protrusion.  There is moderate left greater than right foraminal narrowing and moderately severe bilateral lateral recess stenosis.  There is potential for L5 nerve root compression to either side."  This most likely explains her back and leg pain that is consistent with lumbar spinal stenosis.  We could consider epidural steroid injection but if that is not helpful we could refer to surgery.

## 2021-05-24 ENCOUNTER — Encounter: Payer: Self-pay | Admitting: Cardiology

## 2021-05-24 ENCOUNTER — Telehealth: Payer: Self-pay | Admitting: *Deleted

## 2021-05-24 ENCOUNTER — Other Ambulatory Visit: Payer: Self-pay | Admitting: Neurology

## 2021-05-24 DIAGNOSIS — M5416 Radiculopathy, lumbar region: Secondary | ICD-10-CM

## 2021-05-24 NOTE — Telephone Encounter (Signed)
Pt is asking for a call back from Dr Epimenio Foot with the results to her MRI

## 2021-05-24 NOTE — Progress Notes (Signed)
She may go off of Plavix and Xarelto for 5 days for the [facility]

## 2021-05-24 NOTE — Telephone Encounter (Signed)
Handicap parking placard application completed and signed by Dr. Epimenio Foot. I called the patient and she would like it mailed. I verified her home address and it will be sent out today.

## 2021-05-24 NOTE — Telephone Encounter (Signed)
Patient stated that she reached Dr. Epimenio Foot this morning and was able to discuss her MRI results.

## 2021-05-25 ENCOUNTER — Other Ambulatory Visit: Payer: Self-pay | Admitting: Cardiology

## 2021-05-25 ENCOUNTER — Other Ambulatory Visit: Payer: Self-pay

## 2021-05-25 ENCOUNTER — Ambulatory Visit: Payer: Medicare Other

## 2021-05-25 DIAGNOSIS — I739 Peripheral vascular disease, unspecified: Secondary | ICD-10-CM

## 2021-05-26 ENCOUNTER — Other Ambulatory Visit: Payer: Self-pay | Admitting: Cardiology

## 2021-05-26 DIAGNOSIS — I739 Peripheral vascular disease, unspecified: Secondary | ICD-10-CM

## 2021-05-29 ENCOUNTER — Other Ambulatory Visit: Payer: Self-pay

## 2021-05-29 ENCOUNTER — Ambulatory Visit (INDEPENDENT_AMBULATORY_CARE_PROVIDER_SITE_OTHER): Payer: Medicare Other | Admitting: Podiatry

## 2021-05-29 ENCOUNTER — Encounter: Payer: Self-pay | Admitting: Podiatry

## 2021-05-29 ENCOUNTER — Ambulatory Visit (INDEPENDENT_AMBULATORY_CARE_PROVIDER_SITE_OTHER): Payer: Medicare Other

## 2021-05-29 DIAGNOSIS — M7751 Other enthesopathy of right foot: Secondary | ICD-10-CM

## 2021-05-29 DIAGNOSIS — L603 Nail dystrophy: Secondary | ICD-10-CM | POA: Diagnosis not present

## 2021-05-29 DIAGNOSIS — M2041 Other hammer toe(s) (acquired), right foot: Secondary | ICD-10-CM | POA: Diagnosis not present

## 2021-05-29 DIAGNOSIS — I739 Peripheral vascular disease, unspecified: Secondary | ICD-10-CM

## 2021-05-29 MED ORDER — DEXAMETHASONE SODIUM PHOSPHATE 120 MG/30ML IJ SOLN
2.0000 mg | Freq: Once | INTRAMUSCULAR | Status: AC
  Administered 2021-05-29: 2 mg via INTRA_ARTICULAR

## 2021-05-30 MED ORDER — CILOSTAZOL 50 MG PO TABS: 50.0000 mg | ORAL_TABLET | Freq: Two times a day (BID) | ORAL | 0 refills | Status: DC

## 2021-05-30 NOTE — Progress Notes (Signed)
Subjective:  Patient ID: Monica Park, female    DOB: 1956-06-22,  MRN: [id number] HPI Chief Complaint  Patient presents with  . Foot Pain    5th toe/MPJ right - red, aching x few months, shoes rub, tried medicated corn pad  . Nail Problem    Toenails - thick and discolored  . New Patient (Initial Visit)    65 y.o. female presents with the above complaint.   ROS: Denies fever chills nausea vomiting muscle aches pains calf pain back pain chest pain shortness of breath.  Past Medical History:  Diagnosis Date  . Anxiety   . Hypertension   . Migraine    Past Surgical History:  Procedure Laterality Date  . CESAREAN SECTION     1976, 1979  . OTHER SURGICAL HISTORY  1991   nerve block, right leg  . WRIST SURGERY Left 2009   left arm/wrist    Current Outpatient Medications:  .  albuterol (VENTOLIN HFA) 108 (90 Base) MCG/ACT inhaler, Inhale 2 puffs into the lungs every 4 (four) hours., Disp: , Rfl:  .  atorvastatin (LIPITOR) 40 MG tablet, Take 1 tablet by mouth daily., Disp: , Rfl:  .  baclofen (LIORESAL) 10 MG tablet, 1/2 to 1 mg po tid, Disp: 90 each, Rfl: 5 .  Bempedoic Acid-Ezetimibe (NEXLIZET) 180-10 MG TABS, Take 1 tablet by mouth daily., Disp: 30 tablet, Rfl: 3 .  BIOTIN PO, Take 10,000 mcg by mouth daily., Disp: , Rfl:  .  budesonide-formoterol (SYMBICORT) 160-4.5 MCG/ACT inhaler, Inhale 2 puffs into the lungs 2 (two) times daily., Disp: , Rfl:  .  cetirizine (ZYRTEC) 10 MG tablet, Take 10 mg by mouth daily., Disp: , Rfl:  .  Cholecalciferol (VITAMIN D3 PO), Take 50,000 Units by mouth once a week., Disp: , Rfl:  .  cilostazol (PLETAL) 50 MG tablet, TAKE 1 TABLET(50 MG) BY MOUTH TWICE DAILY, Disp: 180 tablet, Rfl: 0 .  clopidogrel (PLAVIX) 75 MG tablet, Take 1 tablet (75 mg total) by mouth daily., Disp: 30 tablet, Rfl: 3 .  gabapentin (NEURONTIN) 600 MG tablet, Take 1 tablet (600 mg total) by mouth 3 (three) times daily., Disp: 90 tablet, Rfl: 11 .  levothyroxine  (SYNTHROID) 25 MCG tablet, Take 25 mcg by mouth daily., Disp: , Rfl:  .  losartan (COZAAR) 50 MG tablet, Take 50 mg by mouth daily., Disp: , Rfl:  .  montelukast (SINGULAIR) 10 MG tablet, Take 10 mg by mouth daily., Disp: , Rfl:  .  nicotine (NICODERM CQ - DOSED IN MG/24 HOURS) 14 mg/24hr patch, Place 1 patch (14 mg total) onto the skin daily., Disp: 28 patch, Rfl: 2 .  pantoprazole (PROTONIX) 40 MG tablet, Take 40 mg by mouth daily., Disp: , Rfl:  .  Probiotic Product (HEALTHY COLON PO), Take 1 Dose by mouth daily., Disp: , Rfl:  .  Red Yeast Rice 600 MG CAPS, Take 1 capsule by mouth daily., Disp: , Rfl:   Allergies  Allergen Reactions  . Aspirin   . Codeine   . Levaquin [Levofloxacin]   . Penicillins   . Statins     Leg pain  . Toradol [Ketorolac Tromethamine]    Review of Systems Objective:  There were no vitals filed for this visit.  General: Well developed, nourished, in no acute distress, alert and oriented x3   Dermatological: Skin is warm, dry and supple bilateral. Nails x 10 are well maintained; remaining integument appears unremarkable at this time. There are no open sores,  no preulcerative lesions, no rash or signs of infection present.  Vascular: Dorsalis Pedis artery and Posterior Tibial artery pedal pulses are 2/4 bilateral with immedate capillary fill time. Pedal hair growth present. No varicosities and no lower extremity edema present bilateral.   Neruologic: Grossly intact via light touch bilateral. Vibratory intact via tuning fork bilateral. Protective threshold with Semmes Wienstein monofilament intact to all pedal sites bilateral. Patellar and Achilles deep tendon reflexes 2+ bilateral. No Babinski or clonus noted bilateral.   Musculoskeletal: No gross boney pedal deformities bilateral. No pain, crepitus, or limitation noted with foot and ankle range of motion bilateral. Muscular strength 5/5 in all groups tested bilateral.  Gait: Unassisted, Nonantalgic.     Radiographs:  Radiographs taken today demonstrate an osseously mature individual no acute findings soft tissue swelling to the lateral and plantar lateral aspect of the fifth metatarsophalangeal joint area.  Assessment & Plan:   Assessment: Capsulitis bursitis fifth metatarsophalangeal joint hammertoe deformity fifth right nail dystrophy hallux bilateral  Plan: I injected the fifth metatarsal phalangeal joint area or bursitis area today with 2 mg of dexamethasone and local anesthetic.  Also took samples of the toenails to be sent for pathologic evaluation and placed padding on the fifth toe.  A follow-up with her in a month for pathology evaluation    Mahati Vajda T. [place], [place]

## 2021-05-31 ENCOUNTER — Ambulatory Visit: Payer: Medicare Other | Admitting: Cardiology

## 2021-05-31 ENCOUNTER — Encounter: Payer: Self-pay | Admitting: Cardiology

## 2021-05-31 ENCOUNTER — Other Ambulatory Visit: Payer: Self-pay

## 2021-05-31 VITALS — BP 108/61 | HR 70 | Temp 98.1°F | Resp 16 | Ht 65.0 in | Wt 214.0 lb

## 2021-05-31 DIAGNOSIS — F17210 Nicotine dependence, cigarettes, uncomplicated: Secondary | ICD-10-CM

## 2021-05-31 DIAGNOSIS — I2510 Atherosclerotic heart disease of native coronary artery without angina pectoris: Secondary | ICD-10-CM

## 2021-05-31 DIAGNOSIS — I6523 Occlusion and stenosis of bilateral carotid arteries: Secondary | ICD-10-CM

## 2021-05-31 DIAGNOSIS — E782 Mixed hyperlipidemia: Secondary | ICD-10-CM

## 2021-05-31 DIAGNOSIS — I739 Peripheral vascular disease, unspecified: Secondary | ICD-10-CM

## 2021-05-31 MED ORDER — ROSUVASTATIN CALCIUM 10 MG PO TABS: 10.0000 mg | ORAL_TABLET | Freq: Every day | ORAL | 3 refills | Status: DC

## 2021-05-31 MED ORDER — NICOTINE 7 MG/24HR TD PT24: 7.0000 mg | MEDICATED_PATCH | Freq: Every day | TRANSDERMAL | 3 refills | Status: DC

## 2021-05-31 MED ORDER — NICOTINE POLACRILEX 2 MG MT GUM: 2.0000 mg | CHEWING_GUM | OROMUCOSAL | 2 refills | Status: DC | PRN

## 2021-05-31 NOTE — Progress Notes (Signed)
Patient referred by Dr. Jilda Panda for coronary artery disease, carotid bruit  Subjective:   Monica Park, female    DOB: 1956/12/17, 65 y.o.   MRN: [id number]    Chief Complaint  Patient presents with   Coronary Artery Disease   Asymptomatic bilateral carotid artery stenosis   Medical Clearance    Epidural on back     HPI  65 y.o. Caucasian female with hypertension, hyperlipidemia, hypothyroidism, multiple sclerosis, referred for management of CAD, carotid bruit  Patient did not tolerate high dose atorvastatin or rosuvastatin due to myalgias. In addition, she also has bilateral calf claudication, as well as b/l feet pain.   Patient was seen by podiatrist Dr. Milinda Pointer.  She underwent steroid injection in her left fifth metatarsophalangeal joint bursitis area, and had toenail samples taken sent for pathology evaluation.  She is scheduled to undergo epidural steroid injection for lumbar spinal stenosis by Dr. Felecia Shelling.   Initial consultation HPI 02/2021: Three vessel coronary atherosclerosis was noted on lung cancer screening CT chest in 07/2020. She moved from [place] to be close to her ailing stepfather, in 2020. She has minimal mobility limitations from a probable diagnosis of multiple sclerosis. She walks 5,000-10,000 steps/day. She denies chest pain, shortness of breath, palpitations, leg edema, orthopnea, PND, TIA/syncope. She does endorse pain in her left calf on walking, gets better with rest.   She is currently smoking 6-8 cigarettes/day, down from 1 pack/day. She is hoping to quit soon on her own.     Current Outpatient Medications on File Prior to Visit  Medication Sig Dispense Refill   albuterol (VENTOLIN HFA) 108 (90 Base) MCG/ACT inhaler Inhale 2 puffs into the lungs every 4 (four) hours.     atorvastatin (LIPITOR) 40 MG tablet Take 1 tablet by mouth daily.     baclofen (LIORESAL) 10 MG tablet 1/2 to 1 mg po tid 90 each 5   Bempedoic Acid-Ezetimibe (NEXLIZET)  180-10 MG TABS Take 1 tablet by mouth daily. 30 tablet 3   BIOTIN PO Take 10,000 mcg by mouth daily.     budesonide-formoterol (SYMBICORT) 160-4.5 MCG/ACT inhaler Inhale 2 puffs into the lungs 2 (two) times daily.     cetirizine (ZYRTEC) 10 MG tablet Take 10 mg by mouth daily.     Cholecalciferol (VITAMIN D3 PO) Take 50,000 Units by mouth once a week.     cilostazol (PLETAL) 50 MG tablet Take 1 tablet (50 mg total) by mouth 2 (two) times daily. 180 tablet 0   clopidogrel (PLAVIX) 75 MG tablet Take 1 tablet (75 mg total) by mouth daily. 30 tablet 3   gabapentin (NEURONTIN) 600 MG tablet Take 1 tablet (600 mg total) by mouth 3 (three) times daily. 90 tablet 11   levothyroxine (SYNTHROID) 25 MCG tablet Take 25 mcg by mouth daily.     losartan (COZAAR) 50 MG tablet Take 50 mg by mouth daily.     montelukast (SINGULAIR) 10 MG tablet Take 10 mg by mouth daily.     nicotine (NICODERM CQ - DOSED IN MG/24 HOURS) 14 mg/24hr patch Place 1 patch (14 mg total) onto the skin daily. 28 patch 2   pantoprazole (PROTONIX) 40 MG tablet Take 40 mg by mouth daily.     Probiotic Product (HEALTHY COLON PO) Take 1 Dose by mouth daily.     Red Yeast Rice 600 MG CAPS Take 1 capsule by mouth daily.     No current facility-administered medications on file prior to visit.  Cardiovascular and other pertinent studies:  EKG 05/31/2021: Sinus rhythm 64 bpm Normal EKG  Carotid artery duplex 03/27/2021:  Stenosis in the right internal carotid artery (50-69%).  Stenosis in the left internal carotid artery (50-69%). Stenosis in the  left external carotid artery (<50%).  Antegrade right vertebral artery flow. Antegrade left vertebral artery  flow.  Follow up in six months is appropriate if clinically indicated.   ABI 02/07/2021:  This exam reveals mildly decreased perfusion of the right lower extremity,  noted at the post tibial artery level (ABI 0.92) and moderately decreased  perfusion of the left lower extremity,  noted at the anterior tibial and  post tibial artery level (ABI 0.62).  Consider complete lower extremity arterial duplex if clinically indicated.   Echocardiogram 02/07/2021:  1. Normal LV systolic function with visual EF 55-60%. Left ventricle  cavity is normal in size. Mild left ventricular hypertrophy. Normal global  wall motion. Indeterminate diastolic filling pattern, normal LAP.  2. Left atrial cavity is mildly dilated.  3. Mild (Grade I) mitral regurgitation.  4. IVC is dilated with respiratory variation.  5. No prior study for comparison.  EKG 01/04/2021: Sinus rhythm 56 bpm Cannot exclude old anteroseptal infarct  CT Chest 08/15/2020: 1. Lung-RADS 2, benign appearance or behavior. Continue annual screening with low-dose chest CT without contrast in 12 months. 2. Three-vessel coronary atherosclerosis. 3. Aortic Atherosclerosis (ICD10-I70.0) and Emphysema (ICD10-J43.9).   Recent labs: 04/24/2021: Chol 220, TG 124, HDL 49, LDL 149  07/27/2020, 11/03/2020 Glucose 83, BUN/Cr 16/1.25. EGFR 45. Na/K 142/5.2 . Rest of the CMP normal H/H 12.9/39.7. MCV 95.4. Platelets 182 HbA1C N/A Chol 241, TG 123, HDL 51, LDL 165 TSH 1.79 normal   Review of Systems  Musculoskeletal:        B/l feet pain       Vitals:   05/31/21 1130  BP: 108/61  Pulse: 70  Resp: 16  Temp: 98.1 F (36.7 C)  SpO2: 98%    Body mass index is 35.61 kg/m. Filed Weights   05/31/21 1130  Weight: 214 lb (97.1 kg)     Objective:   Physical Exam Cardiovascular:     Pulses:          Dorsalis pedis pulses are 1+ on the left side.       Posterior tibial pulses are 0 on the right side.     Comments: Area superficial skin tear Rt foor 5th toe. No ulceration.  Poor nail hygiene Musculoskeletal:     Right lower leg: No edema.     Left lower leg: No edema.  Feet:     Right foot:     Skin integrity: Dry skin present. No ulcer.     Toenail Condition: Right toenails are abnormally thick. Fungal  disease present.    Left foot:     Skin integrity: Dry skin present. No ulcer.     Toenail Condition: Left toenails are abnormally thick. Fungal disease present.     Assessment & Recommendations:   65 y.o. Caucasian female with hypertension, hyperlipidemia, hypothyroidism, multiple sclerosis, CAD, PAD, b/l mod carotid stenosis   Coronary artery disease involving native coronary artery of native heart without angina pectoris Seen on CT chest 07/2020 No angina symptoms. Mild exertional dyspnea likely related to tobacco dependence. Recommend risk factor modification. Continue Aspirin 81 mg. Did not tolerate high dose atorvastatin or rosuvastatin due to myalgias, nexlizet due to nausea. Currently taking red yeast extract. Not willing to try injectables. LDL down from  165 to 149.  Convinced her to try low dose Crestor 10 mg daily.  PAD (peripheral artery disease) (HCC) Rt ABI 0.82, Lt ABI 0.62. Non-lifestlye limiting claudication. Continue Pletal 50 mg bid, added plavix 75 mg daily. No critical limb ischemia.  Encourage walking after epidural injection for spinal stenosis. If continues to have l lifestyle limiting claudication in spite of optimal medical therapy and regular walking, could consider peripheral angiography and intervention.  Carotid artery disease: Asymptomatic, moderate, bilateral  Mixed hyperlipidemia As above  Nicotine dependence: Tobacco cessation counseling:  - Currently smoking 0.5 packs/day   - Patient was informed of the dangers of tobacco abuse including stroke, cancer, and MI, as well as benefits of tobacco cessation. - Patient is willing to quit at this time. - Approximately 5 mins were spent counseling patient cessation techniques. We discussed various methods to help quit smoking, including deciding on a date to quit, joining a support group, pharmacological agents. Patient would like to use nicotine patch. - I will reassess her progress at the next  follow-up visit  F/u in 3 months    Nigel Mormon, MD Pager: [phone] Office: [phone]

## 2021-06-05 ENCOUNTER — Other Ambulatory Visit: Payer: Medicare Other

## 2021-06-07 ENCOUNTER — Ambulatory Visit
Admission: RE | Admit: 2021-06-07 | Discharge: 2021-06-07 | Disposition: A | Discharge status: Home / Self Care | Payer: Medicare Other | Source: Ambulatory Visit | Attending: Neurology | Admitting: Neurology

## 2021-06-07 ENCOUNTER — Telehealth: Payer: Self-pay | Admitting: Neurology

## 2021-06-07 DIAGNOSIS — M5416 Radiculopathy, lumbar region: Secondary | ICD-10-CM

## 2021-06-07 MED ORDER — METHYLPREDNISOLONE ACETATE 40 MG/ML INJ SUSP (RADIOLOG
80.0000 mg | Freq: Once | INTRAMUSCULAR | Status: AC
  Administered 2021-06-07: 80 mg via EPIDURAL

## 2021-06-07 MED ORDER — IOPAMIDOL (ISOVUE-M 200) INJECTION 41%
1.0000 mL | Freq: Once | INTRAMUSCULAR | Status: AC
  Administered 2021-06-07: 1 mL via EPIDURAL

## 2021-06-07 NOTE — Discharge Instructions (Signed)

## 2021-06-07 NOTE — Telephone Encounter (Signed)
Called pt back. She had ESI this am at 930am for lumbar radiculopathy. Having midback pain that is radiating down her left leg since.I placed her on hold and spoke w/ Dr. Frances Furbish. Per Dr. Frances Furbish, manage w/ OTC meds (advil/tylenol). Make sure she is taking gabapentin 600mg  po TID, if not she can increase to this.  I relayed this to pt. She is unable to take advil but will take tylenol prn. She was only taking gabapentin 600mg  po 1 in the am and 1 in the afternoon. She will take an additional dose of this to see if this helps. Advised ESI can take a couple days to reach max benefit. Continue to rest. She verbalized understanding.

## 2021-06-07 NOTE — Telephone Encounter (Signed)
Pt is asking for a call to request something be called in for her pain in her legs and rear end, please call.

## 2021-06-28 ENCOUNTER — Ambulatory Visit: Payer: Medicare Other | Admitting: Podiatry

## 2021-07-19 ENCOUNTER — Encounter: Payer: Self-pay | Admitting: Podiatry

## 2021-07-19 ENCOUNTER — Ambulatory Visit (INDEPENDENT_AMBULATORY_CARE_PROVIDER_SITE_OTHER): Payer: Medicare Other | Admitting: Podiatry

## 2021-07-19 ENCOUNTER — Other Ambulatory Visit: Payer: Self-pay

## 2021-07-19 DIAGNOSIS — L603 Nail dystrophy: Secondary | ICD-10-CM | POA: Diagnosis not present

## 2021-07-19 NOTE — Progress Notes (Signed)
She presents today for follow-up of her capsulitis bursitis of fifth right.  States that is really doing pretty good has no problems with that whatsoever.  She is also here concerned about her nail fungus.  Objective: Vital signs are stable alert oriented x3 pathology report does demonstrate a nail dystrophy noted fungus.  She also has no pain on palpation fifth met right.  Plan: She will consider removal of the nail and we will follow-up with her in 1 month for reevaluation of the fifth met and bursitis right.

## 2021-08-21 ENCOUNTER — Ambulatory Visit: Payer: Medicare Other | Admitting: Podiatry

## 2021-08-31 ENCOUNTER — Ambulatory Visit: Payer: Medicare Other | Admitting: Cardiology

## 2021-09-05 ENCOUNTER — Ambulatory Visit: Payer: Medicare Other | Admitting: Cardiology

## 2021-09-05 ENCOUNTER — Other Ambulatory Visit: Payer: Self-pay | Admitting: Cardiology

## 2021-09-05 DIAGNOSIS — I739 Peripheral vascular disease, unspecified: Secondary | ICD-10-CM

## 2021-09-05 LAB — LIPID PANEL
Chol/HDL Ratio: 3.8 ratio (ref 0.0–4.4)
Cholesterol, Total: 170 mg/dL (ref 100–199)
HDL: 45 mg/dL (ref >39)
LDL Chol Calc (NIH): 102 mg/dL — ABNORMAL HIGH (ref 0–99)
Triglycerides: 129 mg/dL (ref 0–149)
VLDL Cholesterol Cal: 23 mg/dL (ref 5–40)

## 2021-09-17 ENCOUNTER — Ambulatory Visit: Payer: Medicare Other | Admitting: Cardiology

## 2021-09-17 ENCOUNTER — Other Ambulatory Visit: Payer: Self-pay

## 2021-09-17 ENCOUNTER — Other Ambulatory Visit: Payer: Self-pay | Admitting: Cardiology

## 2021-09-17 ENCOUNTER — Encounter: Payer: Self-pay | Admitting: Cardiology

## 2021-09-17 VITALS — BP 151/65 | HR 77 | Temp 97.8°F | Resp 16 | Ht 65.0 in | Wt 227.0 lb

## 2021-09-17 DIAGNOSIS — I6523 Occlusion and stenosis of bilateral carotid arteries: Secondary | ICD-10-CM

## 2021-09-17 DIAGNOSIS — I739 Peripheral vascular disease, unspecified: Secondary | ICD-10-CM

## 2021-09-17 DIAGNOSIS — I2510 Atherosclerotic heart disease of native coronary artery without angina pectoris: Secondary | ICD-10-CM

## 2021-09-17 MED ORDER — CILOSTAZOL 100 MG PO TABS: 100.0000 mg | ORAL_TABLET | Freq: Two times a day (BID) | ORAL | 2 refills | Status: DC

## 2021-09-17 MED ORDER — SIMVASTATIN 20 MG PO TABS: 20.0000 mg | ORAL_TABLET | Freq: Every day | ORAL | 3 refills | Status: DC

## 2021-09-17 MED ORDER — SIMVASTATIN 40 MG PO TABS: 40.0000 mg | ORAL_TABLET | Freq: Every day | ORAL | 3 refills | Status: DC

## 2021-09-17 NOTE — Progress Notes (Signed)
Patient referred by Dr. Jilda Panda for coronary artery disease, carotid bruit  Subjective:   Monica Park, female    DOB: [birth year], 66 y.o.   MRN: [id number]    Chief Complaint  Patient presents with   Coronary artery disease involving native coronary artery of   Follow-up    3 month     HPI  65 y.o. Caucasian female with hypertension, hyperlipidemia, hypothyroidism, multiple sclerosis, referred for management of CAD, carotid bruit  Patient is here for routine follow-up.  She continues to have worsening claudication symptoms which are not lifestyle limiting.  She can barely walk to her driveway before having to stop due to claudication.  She has tried regular walking, and Pletal 50 mg twice daily without improvement in symptoms.  Fortunately, she does not have any nonhealing wounds or ulcers on her feet.  She also does not have any resting leg pain.  Blood pressure slightly elevated today, generally well controlled.  Reviewed recent lab results with the patient.  Of note, she has not tolerated Crestor or Lipitor due to severe myalgia symptoms.  She is only taking red yeast extract currently.  She has tolerated simvastatin in the past and is open to trying this again.  Positive note, patient quit smoking on June 25, 2021.  She is also cut down red meat in her diet.  LDL is down to 102.   Initial consultation HPI 02/2021: Three vessel coronary atherosclerosis was noted on lung cancer screening CT chest in 07/2020. She moved from [place] to be close to her ailing stepfather, in 2020. She has minimal mobility limitations from a probable diagnosis of multiple sclerosis. She walks 5,000-10,000 steps/day. She denies chest pain, shortness of breath, palpitations, leg edema, orthopnea, PND, TIA/syncope. She does endorse pain in her left calf on walking, gets better with rest.   She is currently smoking 6-8 cigarettes/day, down from 1 pack/day. She is hoping to quit soon on her own.      Current Outpatient Medications on File Prior to Visit  Medication Sig Dispense Refill   albuterol (VENTOLIN HFA) 108 (90 Base) MCG/ACT inhaler Inhale 2 puffs into the lungs every 4 (four) hours.     baclofen (LIORESAL) 10 MG tablet 1/2 to 1 mg po tid 90 each 5   BIOTIN PO Take 10,000 mcg by mouth daily.     budesonide-formoterol (SYMBICORT) 160-4.5 MCG/ACT inhaler Inhale 2 puffs into the lungs 2 (two) times daily.     cetirizine (ZYRTEC) 10 MG tablet Take 10 mg by mouth daily.     Cholecalciferol (VITAMIN D3 PO) Take 50,000 Units by mouth once a week.     cilostazol (PLETAL) 50 MG tablet TAKE 1 TABLET(50 MG) BY MOUTH TWICE DAILY 180 tablet 0   clopidogrel (PLAVIX) 75 MG tablet Take 1 tablet (75 mg total) by mouth daily. 30 tablet 3   gabapentin (NEURONTIN) 600 MG tablet Take 1 tablet (600 mg total) by mouth 3 (three) times daily. 90 tablet 11   levothyroxine (SYNTHROID) 25 MCG tablet Take 25 mcg by mouth daily.     losartan (COZAAR) 50 MG tablet Take 50 mg by mouth daily.     montelukast (SINGULAIR) 10 MG tablet Take 10 mg by mouth daily.     pantoprazole (PROTONIX) 40 MG tablet Take 40 mg by mouth daily.     Probiotic Product (HEALTHY COLON PO) Take 1 Dose by mouth daily.     Red Yeast Rice 600 MG CAPS Take 1  capsule by mouth daily.     No current facility-administered medications on file prior to visit.    Cardiovascular and other pertinent studies:  EKG 05/31/2021: Sinus rhythm 64 bpm Normal EKG  Carotid artery duplex 03/27/2021:  Stenosis in the right internal carotid artery (50-69%).  Stenosis in the left internal carotid artery (50-69%). Stenosis in the  left external carotid artery (<50%).  Antegrade right vertebral artery flow. Antegrade left vertebral artery  flow.  Follow up in six months is appropriate if clinically indicated.   ABI 02/07/2021:  This exam reveals mildly decreased perfusion of the right lower extremity,  noted at the post tibial artery level (ABI  0.92) and moderately decreased  perfusion of the left lower extremity, noted at the anterior tibial and  post tibial artery level (ABI 0.62).  Consider complete lower extremity arterial duplex if clinically indicated.   Echocardiogram 02/07/2021:  1. Normal LV systolic function with visual EF 55-60%. Left ventricle  cavity is normal in size. Mild left ventricular hypertrophy. Normal global  wall motion. Indeterminate diastolic filling pattern, normal LAP.  2. Left atrial cavity is mildly dilated.  3. Mild (Grade I) mitral regurgitation.  4. IVC is dilated with respiratory variation.  5. No prior study for comparison.  EKG 01/04/2021: Sinus rhythm 56 bpm Cannot exclude old anteroseptal infarct  CT Chest 08/15/2020: 1. Lung-RADS 2, benign appearance or behavior. Continue annual screening with low-dose chest CT without contrast in 12 months. 2. Three-vessel coronary atherosclerosis. 3. Aortic Atherosclerosis (ICD10-I70.0) and Emphysema (ICD10-J43.9).   Recent labs: 09/04/2021: Chol 170, TG 129, HDL 45, LDL 102  04/24/2021: Chol 220, TG 124, HDL 49, LDL 149  07/27/2020, 11/03/2020 Glucose 83, BUN/Cr 16/1.25. EGFR 45. Na/K 142/5.2 . Rest of the CMP normal H/H 12.9/39.7. MCV 95.4. Platelets 182 HbA1C N/A Chol 241, TG 123, HDL 51, LDL 165 TSH 1.79 normal   Review of Systems  Cardiovascular:  Positive for claudication. Negative for chest pain, dyspnea on exertion, leg swelling, palpitations and syncope.  Musculoskeletal:        B/l feet pain       Vitals:   09/17/21 1112  BP: (!) 151/65  Pulse: 77  Resp: 16  Temp: 97.8 F (36.6 C)  SpO2: 98%    Body mass index is 37.77 kg/m. Filed Weights   09/17/21 1112  Weight: 227 lb (103 kg)     Objective:   Physical Exam Vitals and nursing note reviewed.  Constitutional:      General: She is not in acute distress. Neck:     Vascular: No JVD.  Cardiovascular:     Rate and Rhythm: Normal rate and regular rhythm.      Pulses:          Femoral pulses are 2+ on the right side and 2+ on the left side.      Popliteal pulses are 0 on the right side and 0 on the left side.       Dorsalis pedis pulses are 0 on the right side and 0 on the left side.       Posterior tibial pulses are 0 on the right side and 0 on the left side.     Heart sounds: Normal heart sounds. No murmur heard. Pulmonary:     Effort: Pulmonary effort is normal.     Breath sounds: Normal breath sounds. No wheezing or rales.  Musculoskeletal:     Right lower leg: No edema.     Left lower leg:  No edema.  Feet:     Right foot:     Skin integrity: Dry skin present. No ulcer.     Toenail Condition: Right toenails are abnormally thick.     Left foot:     Skin integrity: Dry skin present. No ulcer.     Toenail Condition: Left toenails are abnormally thick.      Assessment & Recommendations:   65 y.o. Caucasian female with hypertension, hyperlipidemia, hypothyroidism, multiple sclerosis, CAD, PAD, b/l mod carotid stenosis   Coronary artery disease involving native coronary artery of native heart without angina pectoris Seen on CT chest 07/2020 No angina symptoms. Mild exertional dyspnea likely related to tobacco dependence. Recommend risk factor modification. Not on aspirin, she is on Plavix and Pletal for management of PAD. Severe myalgias with atorvastatin and rosuvastatin, nausea due to nexlizet. LDL down to 102 mg with diet and lifestyle modification.  Willing to try simvastatin 20 mg daily.  I congratulated the patient on quitting smoking.  PAD (peripheral artery disease) (HCC) Rt ABI 0.82, Lt ABI 0.62. Rutherford class III lifestyle limiting claudication.  No resting leg pain no critical ischemia. Discussed different treatment options.  In addition to increasing Pletal to 100 mg twice daily, she would like to proceed with lower extremity angiography and possible intervention.  Tentatively scheduled for November 2022. Continue  plavix 75 mg daily.  Carotid artery disease: Asymptomatic, moderate, bilateral  Mixed hyperlipidemia As above  F/u after peripheral angiogram    Nigel Mormon, MD Pager: [phone] Office: [phone]

## 2021-10-04 ENCOUNTER — Ambulatory Visit: Payer: Medicare Other | Admitting: Neurology

## 2021-10-04 ENCOUNTER — Ambulatory Visit: Payer: Medicare Other

## 2021-10-04 ENCOUNTER — Other Ambulatory Visit: Payer: Self-pay

## 2021-10-04 DIAGNOSIS — I6523 Occlusion and stenosis of bilateral carotid arteries: Secondary | ICD-10-CM

## 2021-10-07 IMAGING — CT CT CHEST LUNG CANCER SCREENING LOW DOSE W/O CM
1 of 2 series · 15 of 32 positions shown, 19 images · non-contrast
Comparison: None.

CLINICAL DATA: 63-year-old asymptomatic female current smoker with
40 pack-year smoking history. History of melanoma.

EXAM:
CT CHEST WITHOUT CONTRAST LOW-DOSE FOR LUNG CANCER SCREENING
TECHNIQUE: Multidetector CT imaging of the chest was performed following the
standard protocol without IV contrast.

[Series 3: ldct screen-lung · axial · 0.62mm/px · z∈[+801,+1066]mm · 15 of 293 slices shown, 19 images]
[im 14/293  mediastinal]
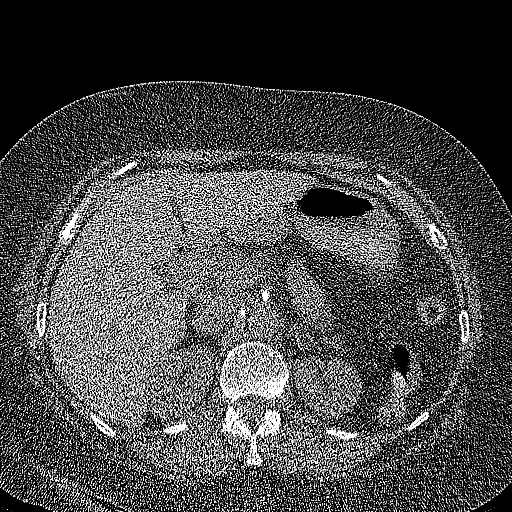
[im 14/293  lung]
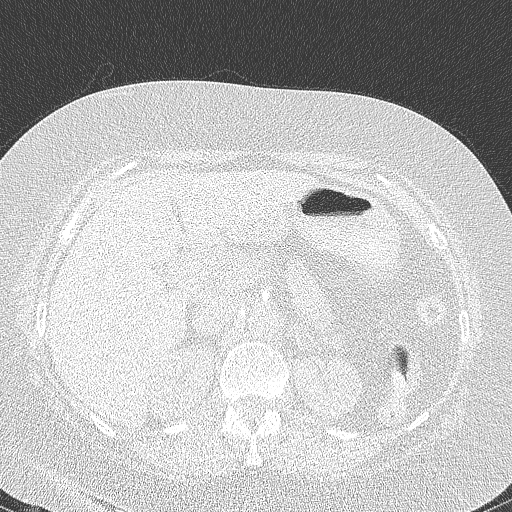
[im 54/293  lung]
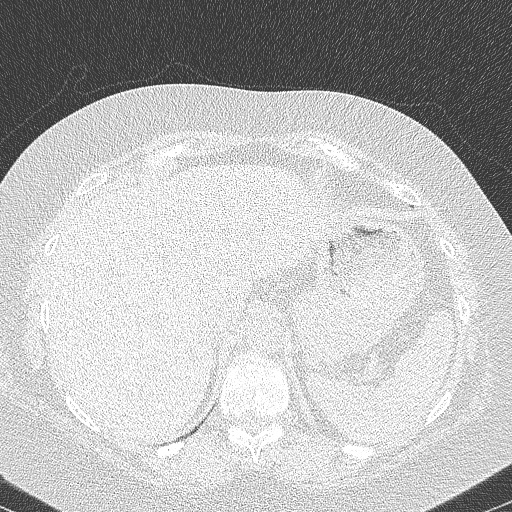
[im 67/293  lung]
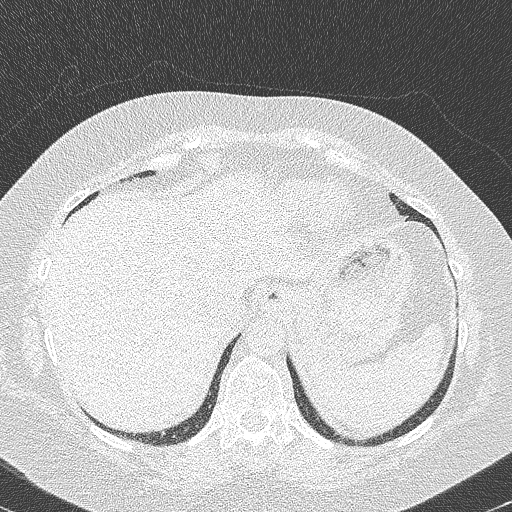
[im 80/293  lung]
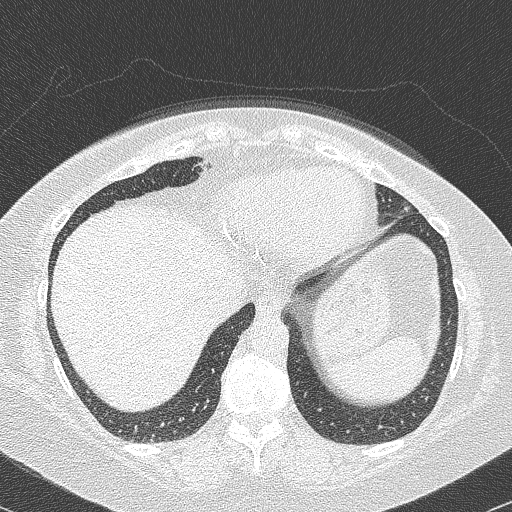
[im 93/293  mediastinal]
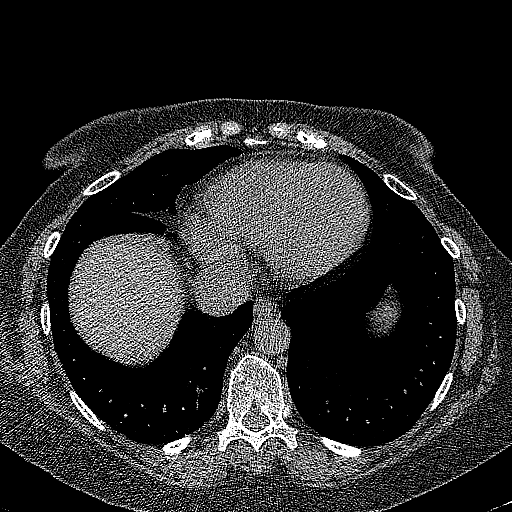
[im 93/293  lung]
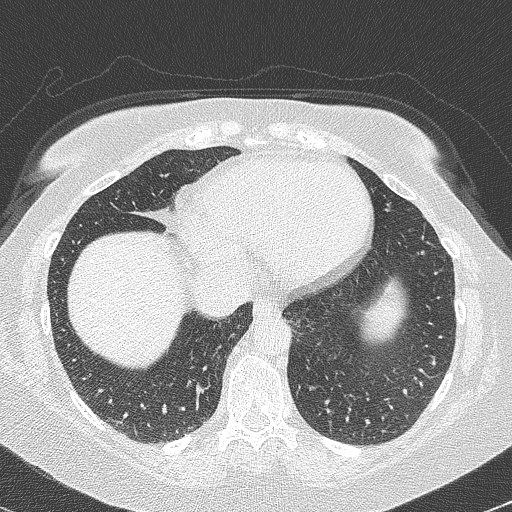
[im 120/293  lung]
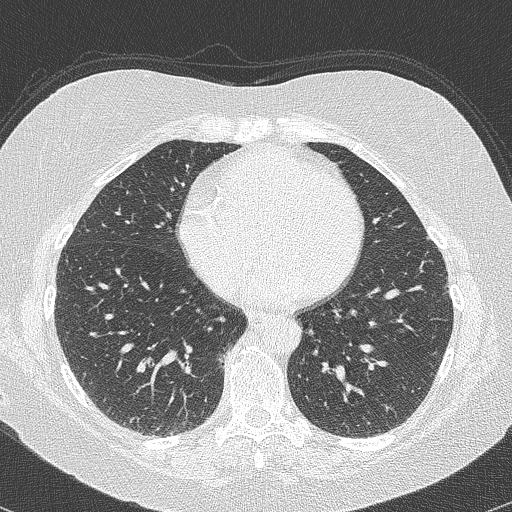
[im 133/293  lung]
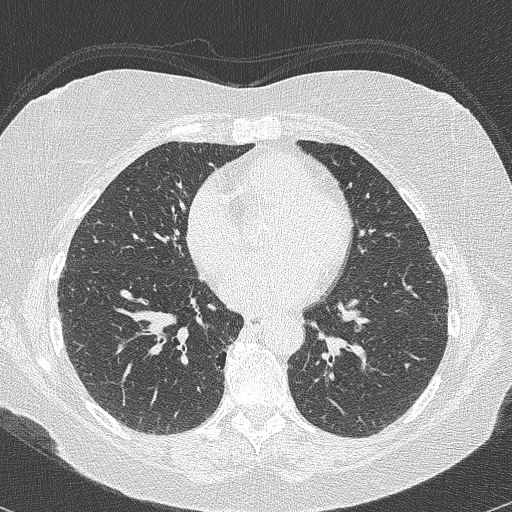
[im 147/293  lung]
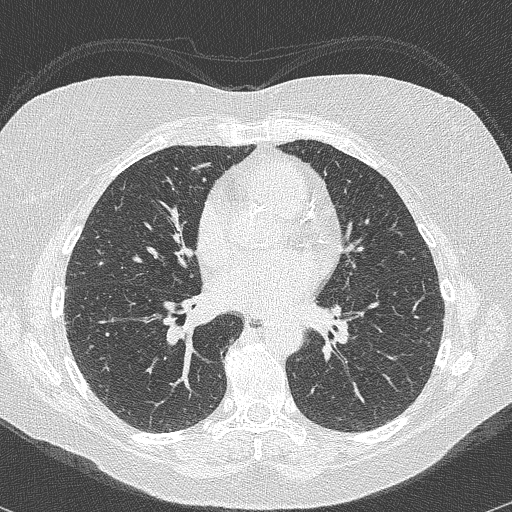
[im 173/293  mediastinal]
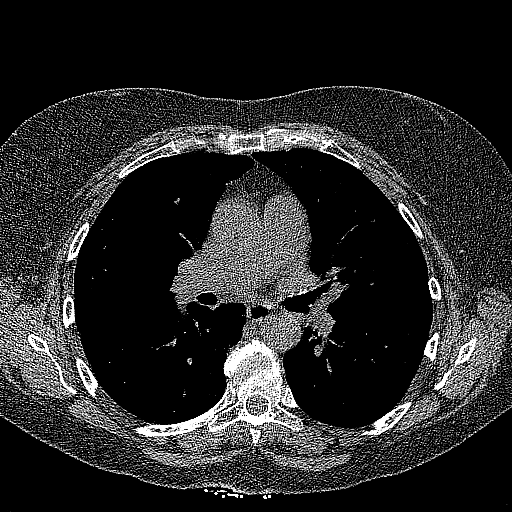
[im 173/293  lung]
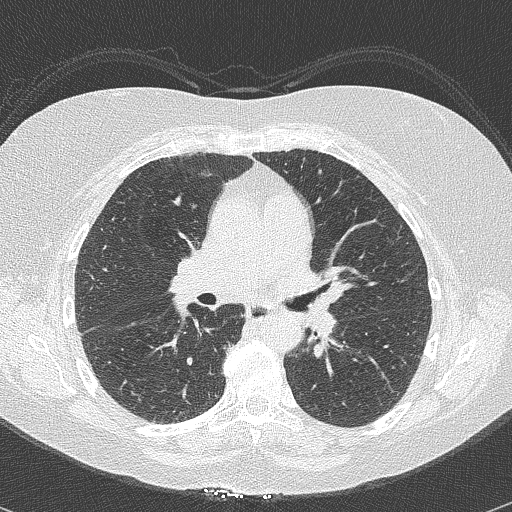
[im 186/293  lung]
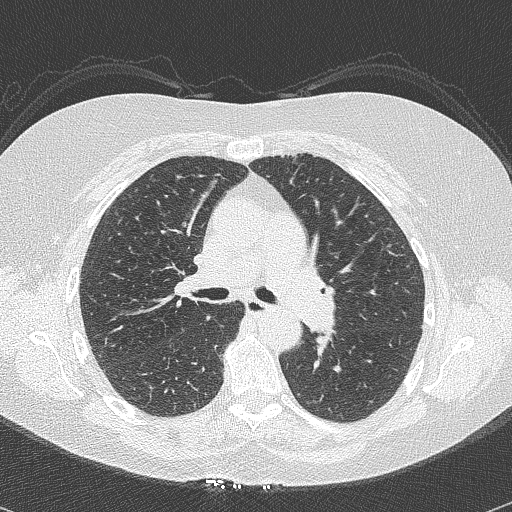
[im 213/293  lung]
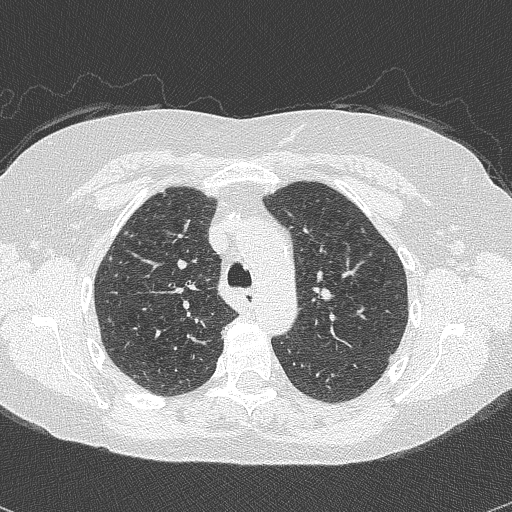
[im 220/293  lung]
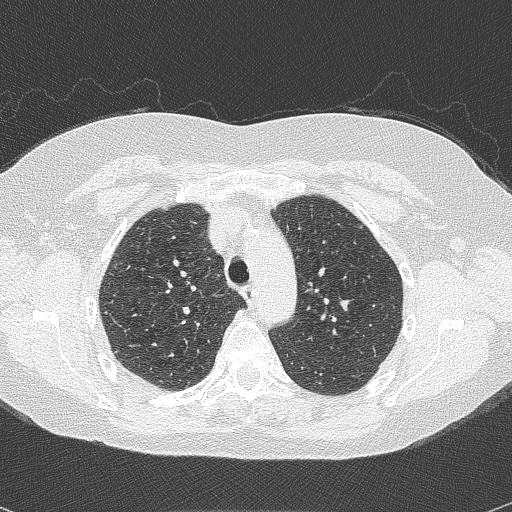
[im 239/293  mediastinal]
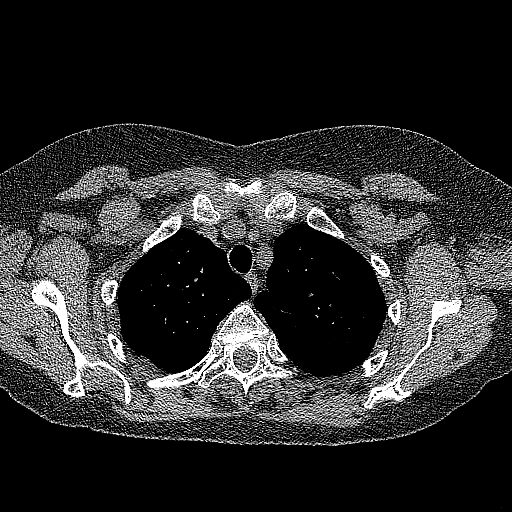
[im 239/293  lung]
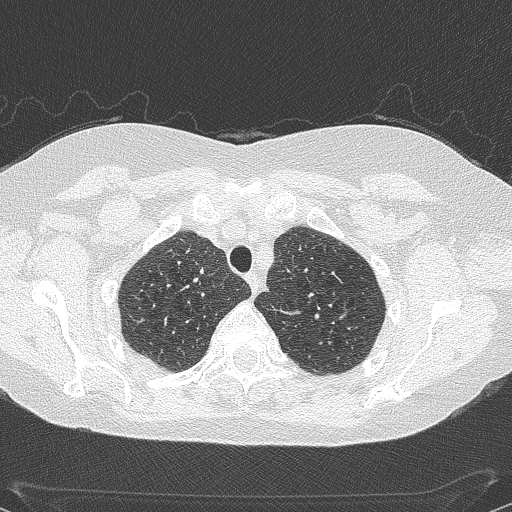
[im 253/293  lung]
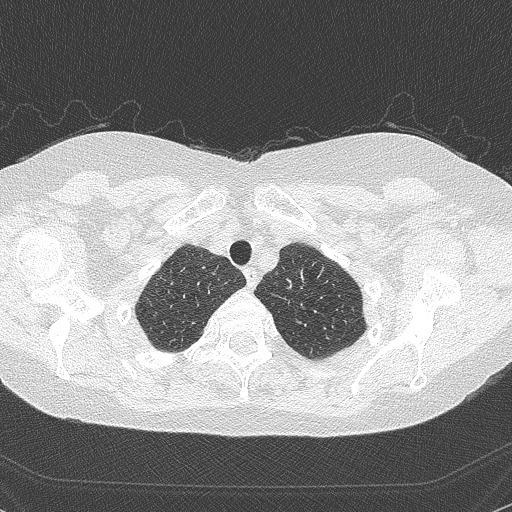
[im 279/293  lung]
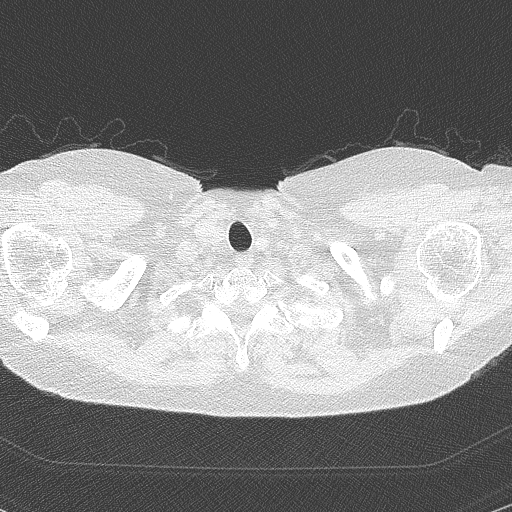

[15 of 32 positions shown; findings below may reference images not displayed]

FINDINGS: Cardiovascular: Normal heart size. No significant pericardial
effusion/thickening. Three-vessel coronary atherosclerosis.
Atherosclerotic nonaneurysmal thoracic aorta. Normal caliber
pulmonary arteries.

Mediastinum/Nodes: No discrete thyroid nodules. Unremarkable
esophagus. No pathologically enlarged axillary, mediastinal or hilar
lymph nodes, noting limited sensitivity for the detection of hilar
adenopathy on this noncontrast study.

Lungs/Pleura: No pneumothorax. No pleural effusion. Moderate
centrilobular emphysema with diffuse bronchial wall thickening. No
acute consolidative airspace disease or lung masses. A few scattered
solid pulmonary nodules, largest 5.9 mm in volume derived mean
diameter in the apical left upper lobe (series 3/image 34).

Upper abdomen: No acute abnormality.

Musculoskeletal: No aggressive appearing focal osseous lesions.
Moderate thoracic spondylosis.
IMPRESSION: 1. Lung-RADS 2, benign appearance or behavior. Continue annual
screening with low-dose chest CT without contrast in 12 months.
2. Three-vessel coronary atherosclerosis.
3. Aortic Atherosclerosis (54CRI-H8U.U) and Emphysema (54CRI-[id number].M).

## 2021-10-25 ENCOUNTER — Other Ambulatory Visit: Payer: Self-pay | Admitting: Internal Medicine

## 2021-10-25 DIAGNOSIS — F17210 Nicotine dependence, cigarettes, uncomplicated: Secondary | ICD-10-CM

## 2021-10-25 DIAGNOSIS — J449 Chronic obstructive pulmonary disease, unspecified: Secondary | ICD-10-CM

## 2021-10-29 ENCOUNTER — Other Ambulatory Visit: Payer: Self-pay | Admitting: Internal Medicine

## 2021-10-29 DIAGNOSIS — Z1231 Encounter for screening mammogram for malignant neoplasm of breast: Secondary | ICD-10-CM

## 2021-11-13 ENCOUNTER — Ambulatory Visit
Admission: RE | Admit: 2021-11-13 | Discharge: 2021-11-13 | Disposition: A | Discharge status: Home / Self Care | Payer: Medicare Other | Source: Ambulatory Visit | Attending: Internal Medicine | Admitting: Internal Medicine

## 2021-11-13 ENCOUNTER — Other Ambulatory Visit: Payer: Self-pay

## 2021-11-13 DIAGNOSIS — J449 Chronic obstructive pulmonary disease, unspecified: Secondary | ICD-10-CM

## 2021-11-13 DIAGNOSIS — F17210 Nicotine dependence, cigarettes, uncomplicated: Secondary | ICD-10-CM

## 2021-11-17 LAB — CBC
Hematocrit: 32.9 % — ABNORMAL LOW (ref 34.0–46.6)
Hemoglobin: 10.5 g/dL — ABNORMAL LOW (ref 11.1–15.9)
MCH: 29.7 pg (ref 26.6–33.0)
MCHC: 31.9 g/dL (ref 31.5–35.7)
MCV: 93 fL (ref 79–97)
Platelets: 221 10*3/uL (ref 150–450)
RBC: 3.53 x10E6/uL — ABNORMAL LOW (ref 3.77–5.28)
RDW: 13.4 % (ref 11.7–15.4)
WBC: 6.1 10*3/uL (ref 3.4–10.8)

## 2021-11-17 LAB — BASIC METABOLIC PANEL
BUN/Creatinine Ratio: 13 (ref 12–28)
BUN: 22 mg/dL (ref 8–27)
CO2: 23 mmol/L (ref 20–29)
Calcium: 9.4 mg/dL (ref 8.7–10.3)
Chloride: 105 mmol/L (ref 96–106)
Creatinine, Ser: 1.66 mg/dL — ABNORMAL HIGH (ref 0.57–1.00)
Glucose: 112 mg/dL — ABNORMAL HIGH (ref 70–99)
Potassium: 5.1 mmol/L (ref 3.5–5.2)
Sodium: 142 mmol/L (ref 134–144)
eGFR: 34 mL/min/{1.73_m2} — ABNORMAL LOW (ref >59)

## 2021-11-19 ENCOUNTER — Other Ambulatory Visit: Payer: Self-pay | Admitting: Cardiology

## 2021-11-19 DIAGNOSIS — I10 Essential (primary) hypertension: Secondary | ICD-10-CM

## 2021-11-19 MED ORDER — AMLODIPINE BESYLATE 5 MG PO TABS: 5.0000 mg | ORAL_TABLET | Freq: Every day | ORAL | 3 refills | Status: DC

## 2021-11-19 NOTE — Progress Notes (Signed)
Called patient to discuss elevated creatinine 1.66.  No answer.  Left voicemail.  Options are, postponing the procedure until improvement of creatinine, or performing diagnostic peripheral angiogram using carbon dioxide and staging intervention, if necessary.  We will discuss these options with the patient when she calls back.  Elder Negus, MD

## 2021-11-19 NOTE — Progress Notes (Signed)
Yes

## 2021-11-19 NOTE — Progress Notes (Signed)
Patient called back to speak with you [phone]

## 2021-11-19 NOTE — Progress Notes (Signed)
Spoke with the patient.  She would like to postpone the procedure pending improvement in her renal function.  In the meantime, have encouraged her to increase hydration, decrease losartan from 50 mg to 25 mg daily.  Added amlodipine 5 mg daily.  Consider nephrology evaluation, defer to PCP Dr. Ludwig Clarks.  Pending improvement in renal function, will reconsider peripheral angiogram and intervention in next few weeks.  Elder Negus, MD

## 2021-11-20 ENCOUNTER — Encounter (HOSPITAL_COMMUNITY): Admission: RE | Payer: Self-pay | Source: Ambulatory Visit

## 2021-11-20 ENCOUNTER — Ambulatory Visit (HOSPITAL_COMMUNITY): Admission: RE | Admit: 2021-11-20 | Payer: Medicare Other | Source: Ambulatory Visit | Admitting: Cardiology

## 2021-11-20 SURGERY — ABDOMINAL AORTOGRAM W/LOWER EXTREMITY
Anesthesia: LOCAL

## 2021-11-28 ENCOUNTER — Ambulatory Visit: Payer: Medicare Other | Admitting: Student

## 2021-11-28 ENCOUNTER — Ambulatory Visit: Payer: Medicare Other | Admitting: Cardiology

## 2021-12-04 LAB — BASIC METABOLIC PANEL
BUN/Creatinine Ratio: 12 (ref 12–28)
BUN: 20 mg/dL (ref 8–27)
CO2: 21 mmol/L (ref 20–29)
Calcium: 10 mg/dL (ref 8.7–10.3)
Chloride: 108 mmol/L — ABNORMAL HIGH (ref 96–106)
Creatinine, Ser: 1.67 mg/dL — ABNORMAL HIGH (ref 0.57–1.00)
Glucose: 91 mg/dL (ref 70–99)
Potassium: 5.2 mmol/L (ref 3.5–5.2)
Sodium: 143 mmol/L (ref 134–144)
eGFR: 34 mL/min/{1.73_m2} — ABNORMAL LOW (ref >59)

## 2021-12-07 NOTE — Progress Notes (Signed)
No plans for peripheral angiography at this time. Patient would like to see the nephrologist first.  Thanks MJP

## 2021-12-07 NOTE — Progress Notes (Signed)
Cr stable, but elevated. Please schedule f/u in 01/2022 for f/u of PAD  Thanks MJP

## 2021-12-16 ENCOUNTER — Other Ambulatory Visit: Payer: Self-pay | Admitting: Cardiology

## 2021-12-16 DIAGNOSIS — I739 Peripheral vascular disease, unspecified: Secondary | ICD-10-CM

## 2021-12-16 DIAGNOSIS — I2510 Atherosclerotic heart disease of native coronary artery without angina pectoris: Secondary | ICD-10-CM

## 2021-12-17 ENCOUNTER — Other Ambulatory Visit: Payer: Self-pay | Admitting: Neurology

## 2021-12-17 ENCOUNTER — Other Ambulatory Visit: Payer: Self-pay | Admitting: Cardiology

## 2021-12-27 ENCOUNTER — Encounter: Payer: Self-pay | Admitting: Neurology

## 2021-12-27 ENCOUNTER — Ambulatory Visit (INDEPENDENT_AMBULATORY_CARE_PROVIDER_SITE_OTHER): Payer: Medicare Other | Admitting: Neurology

## 2021-12-27 VITALS — BP 135/71 | HR 86 | Ht 65.0 in | Wt 236.0 lb

## 2021-12-27 DIAGNOSIS — Z87891 Personal history of nicotine dependence: Secondary | ICD-10-CM

## 2021-12-27 DIAGNOSIS — R9082 White matter disease, unspecified: Secondary | ICD-10-CM

## 2021-12-27 DIAGNOSIS — I10 Essential (primary) hypertension: Secondary | ICD-10-CM | POA: Diagnosis not present

## 2021-12-27 DIAGNOSIS — R208 Other disturbances of skin sensation: Secondary | ICD-10-CM

## 2021-12-27 DIAGNOSIS — I739 Peripheral vascular disease, unspecified: Secondary | ICD-10-CM

## 2021-12-27 MED ORDER — GABAPENTIN 600 MG PO TABS: 600.0000 mg | ORAL_TABLET | Freq: Three times a day (TID) | ORAL | 11 refills | Status: DC

## 2021-12-27 MED ORDER — DULOXETINE HCL 60 MG PO CPEP: 60.0000 mg | ORAL_CAPSULE | Freq: Every day | ORAL | 5 refills | Status: DC

## 2021-12-27 NOTE — Progress Notes (Signed)
[facility]  PATIENT: Monica Park DOB: [birth year]  REFERRING DOCTOR OR PCP: Ralene Ok, MD SOURCE: Patient, notes from primary care, imaging and laboratory reports, MRI images personally reviewed.  _________________________________   HISTORICAL  CHIEF COMPLAINT:  Chief Complaint  Patient presents with   Follow-up    Pt alone, rm 1. Presents today for follow up.     HISTORY OF PRESENT ILLNESS:  Monica Park is a 66 y.o. woman with leg pain, dysesthesias and the possibility of multiple sclerosis.  Update 12/27/2021: She continues to have a lot of leg pain.   She has blockage in her femoral arteries but cna't have surgery due to kidney function.       She feels her gait is also off due to reduced strength.   No recent falls.   She has burning pain in her legs.    Gabapentin has helped the pain some.   And she takes 600 mg po 2-3 times a day.    Due to her renal disease, we can use an NSAID.  She has urinary urgency and frequency with no incontinence.   Vision is usually fine but is worse when she has a headache     She has vascular risk factors:  HTN, hypercholesterolemia, tobacco.   She has quit smoking.     She is on Plavix.  She sleeps well at night unless the stiffness is worse in her legs.  She sometimes feels fatigue and sometimes needs to force herself to do tasks. .She has some anxiety but not depression.    She has a lot of stress with her dad just passing (metastatic lung cancer).  Her son in [place]) just had a stroke.          HISTORY:  She was diagnosed with MS in [place] at age 3 based on her symptoms and MRI.   She never had a lumbar puncture (and does not wan one).   At the time she had stiffness in her legs and memory issues.    She was also experiencing headaches.    She was not placed on any DMTs.    Her main problem is leg pain . Tylenol helps a little bit.    Baclofen in the past helped more than flexeril.     Vascular risk factors:    She has been on medications for a few years.   She smokes 1/2 ppd now but used to smoke 2 ppd.     In 2007, she had an accident with LOC and was told she injured her brain.  She feels she lost old memories as well as more trouble forming new memory.     She has stage 3 CRF.        DATA REVIEWED: MRI of the brain 09/30/2020 shows multiple T2/FLAIR hyperintense foci in the white matter of both hemispheres.  There are no foci in the infratentorial white matter.  Most of th lesions are in the deep white matter.  Some foci are subcortical but there do not appear to be juxtacortical foci.  There are a few lesions that are in the periventricular white matter, radially oriented to the ventricles.  None of the foci enhance after contrast.  I compared to the 2015 MRI.  The overall pattern is similar though there has been some progression with enlargement of multiple foci more consistent with chronic microvascular ischemic change.  There are a couple new foci that are small.  MRI of the  cervical spine 09/11/2020 shows disc protrusions at C3-C4 through C6-C7 but no nerve root compression or spinal cord appears normal.  Normal enhancement pattern.  MRI of the lumbar spine 02/05/2008 showed minimal disc bulging at L4-L5 that does not cause spinal stenosis or nerve root compression.  At L5-S1, there is a small disc bulge, disc degradation and right greater than left facet hypertrophy.  There is no significant foraminal narrowing and only minimal lateral recess stenosis.  No nerve root compression or spinal stenosis.   MRI of the brain 10/19/2014 showed multiple T2/FLAIR hyperintense foci.  The pattern is mostly nonspecific with a few foci in the periventricular white matter but most foci in the deep white matter or subcortical white matter. Recent lab work shows elevated creatinine with reduced GFR, normal blood count, normal lipid panel.  EKG showed bradycardia with a rate of 47.  REVIEW OF  SYSTEMS: Constitutional: No fevers, chills, sweats, or change in appetite Eyes: No visual changes, double vision, eye pain Ear, nose and throat: No hearing loss, ear pain, nasal congestion, sore throat Cardiovascular: No chest pain, palpitations Respiratory:  No shortness of breath at rest or with exertion.   No wheezes GastrointestinaI: No nausea, vomiting, diarrhea, abdominal pain, fecal incontinence Genitourinary:  No dysuria, urinary retention or frequency.  No nocturia. Musculoskeletal:  No neck pain, back pain Integumentary: No rash, pruritus, skin lesions Neurological: as above Psychiatric: No depression at this time.  No anxiety Endocrine: No palpitations, diaphoresis, change in appetite, change in weigh or increased thirst Hematologic/Lymphatic:  No anemia, purpura, petechiae. Allergic/Immunologic: No itchy/runny eyes, nasal congestion, recent allergic reactions, rashes  ALLERGIES: Allergies  Allergen Reactions   Statins Other (See Comments)    Leg pain   Aspirin    Codeine    Levaquin [Levofloxacin]    Penicillins    Toradol [Ketorolac Tromethamine]     HOME MEDICATIONS:  Current Outpatient Medications:    albuterol (VENTOLIN HFA) 108 (90 Base) MCG/ACT inhaler, Inhale 2 puffs into the lungs every 4 (four) hours., Disp: , Rfl:    amLODipine (NORVASC) 5 MG tablet, Take 1 tablet (5 mg total) by mouth daily., Disp: 30 tablet, Rfl: 3   BIOTIN PO, Take 10,000 mcg by mouth daily., Disp: , Rfl:    budesonide-formoterol (SYMBICORT) 160-4.5 MCG/ACT inhaler, Inhale 2 puffs into the lungs 2 (two) times daily., Disp: , Rfl:    cetirizine (ZYRTEC) 10 MG tablet, Take 10 mg by mouth daily., Disp: , Rfl:    Cholecalciferol (VITAMIN D3 PO), Take 50,000 Units by mouth once a week., Disp: , Rfl:    cilostazol (PLETAL) 100 MG tablet, TAKE 1 TABLET(100 MG) BY MOUTH TWICE DAILY, Disp: 60 tablet, Rfl: 2   clopidogrel (PLAVIX) 75 MG tablet, TAKE 1 TABLET(75 MG) BY MOUTH DAILY, Disp: 90 tablet,  Rfl: 3   DULoxetine (CYMBALTA) 60 MG capsule, Take 1 capsule (60 mg total) by mouth daily., Disp: 30 capsule, Rfl: 5   gabapentin (NEURONTIN) 600 MG tablet, Take 1 tablet (600 mg total) by mouth 3 (three) times daily., Disp: 90 tablet, Rfl: 5   levothyroxine (SYNTHROID) 25 MCG tablet, Take 25 mcg by mouth daily., Disp: , Rfl:    losartan (COZAAR) 50 MG tablet, Take 25 mg by mouth daily., Disp: , Rfl:    montelukast (SINGULAIR) 10 MG tablet, Take 10 mg by mouth daily., Disp: , Rfl:    pantoprazole (PROTONIX) 40 MG tablet, Take 40 mg by mouth daily., Disp: , Rfl:    Probiotic Product (  HEALTHY COLON PO), Take 1 Dose by mouth daily., Disp: , Rfl:    Red Yeast Rice 600 MG CAPS, Take 1 capsule by mouth daily., Disp: , Rfl:   PAST MEDICAL HISTORY: Past Medical History:  Diagnosis Date   Anxiety    Hypertension    Migraine     PAST SURGICAL HISTORY: Past Surgical History:  Procedure Laterality Date   CESAREAN SECTION     1976, 1979   OTHER SURGICAL HISTORY  1991   nerve block, right leg   WRIST SURGERY Left 2009   left arm/wrist    FAMILY HISTORY: Family History  Problem Relation Age of Onset   Parkinson's disease Mother    Cervical cancer Mother    Dementia Father    Lung disease Father    Rheum arthritis Sister    Thyroid cancer Sister    Sarcoidosis Sister    Heart Problems Brother     SOCIAL HISTORY:  Social History   Socioeconomic History   Marital status: Divorced    Spouse name: Not on file   Number of children: 2   Years of education: 10   Highest education level: Not on file  Occupational History   Occupation: SSD  Tobacco Use   Smoking status: Former    Packs/day: 0.50    Years: 30.00    Pack years: 15.00    Types: Cigarettes    Quit date: 06/25/2021    Years since quitting: 0.5   Smokeless tobacco: Never  Vaping Use   Vaping Use: Never used  Substance and Sexual Activity   Alcohol use: Not Currently   Drug use: Never   Sexual activity: Not on file   Other Topics Concern   Not on file  Social History Narrative   Lives with sister, Patty    Right handed   Caffeine use: coffee twice daily   Social Determinants of Health   Financial Resource Strain: Not on file  Food Insecurity: Not on file  Transportation Needs: Not on file  Physical Activity: Not on file  Stress: Not on file  Social Connections: Not on file  Intimate Partner Violence: Not on file     PHYSICAL EXAM  Vitals:   12/27/21 1417  BP: 135/71  Pulse: 86  Weight: 236 lb (107 kg)  Height: 5\' 5"  (1.651 m)    Body mass index is 39.27 kg/m.   General: The patient is well-developed and well-nourished and in no acute distress  HEENT:  Head is [place]/AT.  Sclera are anicteric.    Skin: Extremities are without rash or  edema.  Musculoskeletal:  Back is tender over lower lumbar paraspiak region  Neurologic Exam  Mental status: The patient is alert and oriented x 3 at the time of the examination.   Speech is normal.  Cranial nerves: Extraocular movements are full.  Facial strength and sensation was normal.  No dysarthria.  No obvious hearing deficits are noted.  Motor:  Muscle bulk is normal.   Tone is normal. Strength is  5 / 5 in all 4 extremities except 4+ EHL and intrinsic toe muscles  Sensory: Sensory testing is intact to pinprick, soft touch and vibration sensation in arms but mild reduced pinprick in left foot uo to ankle and reduced vibration to about 50% at the toes., normal at ankles.   Coordination: Cerebellar testing reveals good finger-nose-finger and heel-to-shin bilaterally.  Gait and station: Station is normal.   Gait is arthritic and mildly wide and mild  reduced stride. Tandem gait is wide.   Reflexes: Deep tendon reflexes are symmetric and normal in arms.       ASSESSMENT AND PLAN  White matter abnormality on MRI of brain  Dysesthesia  Essential hypertension  History of tobacco use  PAD (peripheral artery disease) (HCC)   1.   MRI changes could be chronic microvascular ischemic changes, demyelination or combination   Compared to 2015, there has been some progression but the pattern of progression is nonspecific.  I feel the likelihood she has MS is about 1/4.  The majority of the foci on her her brain MRI due to chronic microvascular ischemic changes, probably because of the hypertension and smoking.    However, a few foci are periventricular, radially oriented to the ventricles and MS cannot be completely ruled out.  There were no enhancing lesions on either MRI.  We discussed CSF analysis might allow [place] to more accurately rule her in or out for MS but she would prefer not to do a lumbar puncture at this time. 2.   Continue gabapentin. 3.   Check MRI again in latw 2023 (2 yrs from last one) to see if progression and consider LP or treatment if progressed on pattern possibly c/w MS.  She has better control of vascular erisk factors (quit smoking, BP usually good).   4.   Continue off tobacco. 5.   She also has some depression.   Will start Cymbalta as it may also help pain some.    6.   rtc 9 months sooner if new or worsening issues.  Consider MRI aroud that time.      Nyshawn Gowdy A. Epimenio Foot, MD, [facility] 12/27/2021, 2:52 PM Certified in Neurology, Clinical Neurophysiology, Sleep Medicine and Neuroimaging  [facility] [address], [address] [place], [place] [address] [phone]

## 2021-12-31 ENCOUNTER — Other Ambulatory Visit: Payer: Self-pay | Admitting: Nephrology

## 2021-12-31 DIAGNOSIS — N1832 Chronic kidney disease, stage 3b: Secondary | ICD-10-CM

## 2022-01-08 ENCOUNTER — Ambulatory Visit
Admission: RE | Admit: 2022-01-08 | Discharge: 2022-01-08 | Disposition: A | Discharge status: Home / Self Care | Payer: Medicare Other | Source: Ambulatory Visit | Attending: Nephrology | Admitting: Nephrology

## 2022-01-08 ENCOUNTER — Other Ambulatory Visit: Payer: Self-pay

## 2022-01-08 DIAGNOSIS — N1832 Chronic kidney disease, stage 3b: Secondary | ICD-10-CM

## 2022-01-23 NOTE — Progress Notes (Signed)
Patient referred by Dr. Jilda Panda for coronary artery disease, carotid bruit  Subjective:   Monica Park, female    DOB: Jun 19, 1956, 66 y.o.   MRN: [id number]    Chief Complaint  Patient presents with   Follow-up   PAD     HPI  66 y.o. Caucasian female with hypertension, hyperlipidemia, hypothyroidism, multiple sclerosis, referred for management of CAD, carotid bruit.  Patient quit smoking in July 2022.  Since last seen in our office 09/17/2021 by Dr. Virgina Jock at which time shared decision was to proceed with peripheral angiography and possible intervention.  However preprocedural laboratory testing showed elevated creatinine, therefore shared decision was to postpone peripheral angiogram and reduce losartan from 50 mg to 25 mg daily.  Also added amlodipine 5 mg for hypertension control.  Creatinine has improved since last office visit with follow-up with nephrologist, labs below for reference.  Patient now presents for follow of PAD.  Since last office visit patient's claudication symptoms have worsened.  She is now struggling to walk up the stairs in her home without having to stop due to claudication.  Leg pain is bilateral, however worse in her left leg.  Fortunately she does not have any knee nonhealing wounds or ulcers and no resting leg pain.  Blood pressure is well controlled.  She is continue to refrain from smoking since June 25, 2021.   Initial consultation HPI 02/2021: Three vessel coronary atherosclerosis was noted on lung cancer screening CT chest in 07/2020. She moved from [place] to be close to her ailing stepfather, in 2020. She has minimal mobility limitations from a probable diagnosis of multiple sclerosis. She walks 5,000-10,000 steps/day. She denies chest pain, shortness of breath, palpitations, leg edema, orthopnea, PND, TIA/syncope. She does endorse pain in her left calf on walking, gets better with rest.   She is currently smoking 6-8 cigarettes/day, down  from 1 pack/day. She is hoping to quit soon on her own.    Current Outpatient Medications on File Prior to Visit  Medication Sig Dispense Refill   albuterol (VENTOLIN HFA) 108 (90 Base) MCG/ACT inhaler Inhale 2 puffs into the lungs every 4 (four) hours.     amLODipine (NORVASC) 5 MG tablet Take 1 tablet (5 mg total) by mouth daily. 30 tablet 3   BIOTIN PO Take 10,000 mcg by mouth daily.     budesonide-formoterol (SYMBICORT) 160-4.5 MCG/ACT inhaler Inhale 2 puffs into the lungs 2 (two) times daily.     cetirizine (ZYRTEC) 10 MG tablet Take 10 mg by mouth daily.     Cholecalciferol (VITAMIN D3 PO) Take 50,000 Units by mouth once a week.     cilostazol (PLETAL) 100 MG tablet TAKE 1 TABLET(100 MG) BY MOUTH TWICE DAILY 60 tablet 2   clopidogrel (PLAVIX) 75 MG tablet TAKE 1 TABLET(75 MG) BY MOUTH DAILY 90 tablet 3   DULoxetine (CYMBALTA) 60 MG capsule Take 1 capsule (60 mg total) by mouth daily. 30 capsule 5   gabapentin (NEURONTIN) 600 MG tablet Take 1 tablet (600 mg total) by mouth 3 (three) times daily. 90 tablet 11   levothyroxine (SYNTHROID) 25 MCG tablet Take 25 mcg by mouth daily.     losartan (COZAAR) 50 MG tablet Take 25 mg by mouth daily.     montelukast (SINGULAIR) 10 MG tablet Take 10 mg by mouth daily.     pantoprazole (PROTONIX) 40 MG tablet Take 40 mg by mouth daily.     Probiotic Product (HEALTHY COLON PO) Take 1  Dose by mouth daily.     Red Yeast Rice 600 MG CAPS Take 1 capsule by mouth daily.     No current facility-administered medications on file prior to visit.    Cardiovascular and other pertinent studies: EKG 01/24/2022:  Sinus rhythm at a rate of 79 bpm.  Normal axis.  Nonspecific T wave abnormality.  No evidence of ischemia or underlying injury pattern.  Renal artery duplex 01/08/2022: 1. Technically challenging examination secondary to patient body habitus. CT or MR arteriogram may be more sensitive for the detection of renal artery stenosis. 2. No convincing evidence  of hemodynamically significant renal artery stenosis by duplex ultrasound. 3. Renal size discrepancy. The left kidney is smaller than the right.  Carotid artery duplex 10/04/2021:  Duplex suggests stenosis in the right internal carotid artery (16-49%).  Duplex suggests stenosis in the right external carotid artery (<50%).  Heterogeneous plaque noted.  Duplex suggests stenosis in the left internal carotid artery (50-69%).  Duplex suggests stenosis in the left external carotid artery (<50%).  Homogeneous plaque noted.  Antegrade right vertebral artery flow. Antegrade left vertebral artery flow.  Compared to the study done on 03/27/2021, right ICA stenosis severity has reduced from 50 to 69% to the present less than 50%. Follow up in six months is appropriate if clinically indicated.  EKG 05/31/2021: Sinus rhythm 64 bpm Normal EKG  ABI 02/07/2021:  This exam reveals mildly decreased perfusion of the right lower extremity,  noted at the post tibial artery level (ABI 0.92) and moderately decreased  perfusion of the left lower extremity, noted at the anterior tibial and  post tibial artery level (ABI 0.62).  Consider complete lower extremity arterial duplex if clinically indicated.   Echocardiogram 02/07/2021:  1. Normal LV systolic function with visual EF 55-60%. Left ventricle  cavity is normal in size. Mild left ventricular hypertrophy. Normal global  wall motion. Indeterminate diastolic filling pattern, normal LAP.  2. Left atrial cavity is mildly dilated.  3. Mild (Grade I) mitral regurgitation.  4. IVC is dilated with respiratory variation.  5. No prior study for comparison.  EKG 01/04/2021: Sinus rhythm 56 bpm Cannot exclude old anteroseptal infarct  CT Chest 08/15/2020: 1. Lung-RADS 2, benign appearance or behavior. Continue annual screening with low-dose chest CT without contrast in 12 months. 2. Three-vessel coronary atherosclerosis. 3. Aortic Atherosclerosis (ICD10-I70.0) and  Emphysema (ICD10-J43.9).   Recent labs: 12/26/2021: BUN 20, creatinine 1.46, GFR 39, sodium 138, potassium 4.3 Hgb 11.4, HCT 35.4, MCV 92, platelet 250  12/03/2021: BUN 20, creatinine 1.67, GFR 34, sodium 143, potassium 5.2  09/04/2021: Chol 170, TG 129, HDL 45, LDL 102  04/24/2021: Chol 220, TG 124, HDL 49, LDL 149  07/27/2020, 11/03/2020 Glucose 83, BUN/Cr 16/1.25. EGFR 45. Na/K 142/5.2 . Rest of the CMP normal H/H 12.9/39.7. MCV 95.4. Platelets 182 HbA1C N/A Chol 241, TG 123, HDL 51, LDL 165 TSH 1.79 normal   Review of Systems  Cardiovascular:  Positive for claudication. Negative for chest pain, dyspnea on exertion, leg swelling, palpitations and syncope.  Musculoskeletal:        B/l feet pain       Vitals:   01/24/22 0949 01/24/22 0950  BP:    Pulse:    Resp:    Temp:    SpO2: 91% 99%    Body mass index is 38.61 kg/m. Filed Weights   01/24/22 0939  Weight: 232 lb (105.2 kg)     Objective:   Physical Exam Vitals and nursing note  reviewed.  Constitutional:      General: She is not in acute distress. Neck:     Vascular: No JVD.  Cardiovascular:     Rate and Rhythm: Normal rate and regular rhythm.     Pulses:          Femoral pulses are 2+ on the right side and 2+ on the left side.      Popliteal pulses are 0 on the right side and 0 on the left side.       Dorsalis pedis pulses are 0 on the right side and 0 on the left side.       Posterior tibial pulses are 0 on the right side and 0 on the left side.     Heart sounds: Normal heart sounds. No murmur heard. Pulmonary:     Effort: Pulmonary effort is normal.     Breath sounds: Normal breath sounds. No wheezing or rales.  Musculoskeletal:     Right lower leg: No edema.     Left lower leg: No edema.  Feet:     Right foot:     Skin integrity: Dry skin present. No ulcer.     Toenail Condition: Right toenails are abnormally thick.     Left foot:     Skin integrity: Dry skin present. No ulcer.     Toenail  Condition: Left toenails are abnormally thick.  Physical exam unchanged compared to previous office visit.    Assessment & Recommendations:   66 y.o. Caucasian female with hypertension, hyperlipidemia, hypothyroidism, multiple sclerosis, CAD, PAD, b/l mod carotid stenosis   Coronary artery disease involving native coronary artery of native heart without angina pectoris Seen on CT chest 07/2020 Patient continues to have mild exertional dyspnea which is stable, likely secondary to history of tobacco use She is not on aspirin as she is currently on Plavix and Pletal for PAD Patient has a history of myalgias with atorvastatin and rosuvastatin.  Also unable to tolerate Nexlizet due to nausea At last office visit had added simvastatin, patient again developed myalgias.  Could consider Repatha at next office visit.  PAD (peripheral artery disease) (HCC) Rt ABI 0.82, Lt ABI 0.62. Rutherford class III lifestyle limiting claudication.   Patient denies rest pain.  There are no ulcers or wounds.  No evidence of critical limb ischemia. Continue Plavix and Pletal Again discussed indication, risk, benefits of lower extremity angiography and possible intervention.  Given the patient's creatinine has improved I discussed procedure with Dr. Virgina Jock and he feels would be appropriate to proceed with this if patient is willing.  Patient verbalized understanding of the risks and wishes to proceed.  We will plan to do CO2 lower extremity angiography with minimal use of contrast for intervention. Will repeat BMP and CBC prior to procedure.  Hypertension: Blood pressure is well controlled Continue losartan and amlodipine  Carotid artery disease: Asymptomatic, moderate, bilateral  Mixed hyperlipidemia Unable to tolerate simvastatin, atorvastatin, rosuvastatin, nexlizet  Will consider addition of Repatha at next office visit  Follow-up after peripheral angiography  Patient was seen in collaboration with  Dr. Virgina Jock and he is in agreement with the plan.    Alethia Berthold, PA-C 01/24/2022, 12:45 PM Office: [phone]

## 2022-01-24 ENCOUNTER — Encounter: Payer: Self-pay | Admitting: Student

## 2022-01-24 ENCOUNTER — Other Ambulatory Visit: Payer: Self-pay

## 2022-01-24 ENCOUNTER — Ambulatory Visit: Payer: Medicare Other | Admitting: Student

## 2022-01-24 VITALS — BP 126/76 | HR 85 | Temp 98.2°F | Resp 17 | Ht 65.0 in | Wt 232.0 lb

## 2022-01-24 DIAGNOSIS — I10 Essential (primary) hypertension: Secondary | ICD-10-CM

## 2022-01-24 DIAGNOSIS — I2510 Atherosclerotic heart disease of native coronary artery without angina pectoris: Secondary | ICD-10-CM

## 2022-01-24 DIAGNOSIS — E782 Mixed hyperlipidemia: Secondary | ICD-10-CM

## 2022-01-24 DIAGNOSIS — I6523 Occlusion and stenosis of bilateral carotid arteries: Secondary | ICD-10-CM

## 2022-01-24 DIAGNOSIS — I739 Peripheral vascular disease, unspecified: Secondary | ICD-10-CM

## 2022-02-08 LAB — BASIC METABOLIC PANEL
BUN/Creatinine Ratio: 9 — ABNORMAL LOW (ref 12–28)
BUN: 14 mg/dL (ref 8–27)
CO2: 23 mmol/L (ref 20–29)
Calcium: 9.5 mg/dL (ref 8.7–10.3)
Chloride: 104 mmol/L (ref 96–106)
Creatinine, Ser: 1.59 mg/dL — ABNORMAL HIGH (ref 0.57–1.00)
Glucose: 114 mg/dL — ABNORMAL HIGH (ref 70–99)
Potassium: 4.4 mmol/L (ref 3.5–5.2)
Sodium: 143 mmol/L (ref 134–144)
eGFR: 36 mL/min/{1.73_m2} — ABNORMAL LOW (ref >59)

## 2022-02-08 LAB — CBC
Hematocrit: 34 % (ref 34.0–46.6)
Hemoglobin: 11.2 g/dL (ref 11.1–15.9)
MCH: 29.5 pg (ref 26.6–33.0)
MCHC: 32.9 g/dL (ref 31.5–35.7)
MCV: 90 fL (ref 79–97)
Platelets: 292 10*3/uL (ref 150–450)
RBC: 3.8 x10E6/uL (ref 3.77–5.28)
RDW: 13.5 % (ref 11.7–15.4)
WBC: 8.3 10*3/uL (ref 3.4–10.8)

## 2022-02-12 ENCOUNTER — Encounter (HOSPITAL_COMMUNITY)
Admission: RE | Disposition: A | Discharge status: Home / Self Care | Payer: Self-pay | Source: Ambulatory Visit | Attending: Cardiology

## 2022-02-12 ENCOUNTER — Other Ambulatory Visit: Payer: Self-pay | Admitting: Cardiology

## 2022-02-12 ENCOUNTER — Observation Stay (HOSPITAL_COMMUNITY)
Admission: RE | Admit: 2022-02-12 | Discharge: 2022-02-14 | Disposition: A | Discharge status: Home / Self Care | Payer: Medicare Other | Source: Ambulatory Visit | Attending: Cardiology | Admitting: Cardiology

## 2022-02-12 DIAGNOSIS — I10 Essential (primary) hypertension: Secondary | ICD-10-CM | POA: Diagnosis not present

## 2022-02-12 DIAGNOSIS — J439 Emphysema, unspecified: Secondary | ICD-10-CM | POA: Diagnosis not present

## 2022-02-12 DIAGNOSIS — Z7901 Long term (current) use of anticoagulants: Secondary | ICD-10-CM | POA: Insufficient documentation

## 2022-02-12 DIAGNOSIS — Z87891 Personal history of nicotine dependence: Secondary | ICD-10-CM | POA: Insufficient documentation

## 2022-02-12 DIAGNOSIS — E039 Hypothyroidism, unspecified: Secondary | ICD-10-CM | POA: Diagnosis not present

## 2022-02-12 DIAGNOSIS — R0602 Shortness of breath: Secondary | ICD-10-CM

## 2022-02-12 DIAGNOSIS — Z20822 Contact with and (suspected) exposure to covid-19: Secondary | ICD-10-CM | POA: Insufficient documentation

## 2022-02-12 DIAGNOSIS — I739 Peripheral vascular disease, unspecified: Principal | ICD-10-CM

## 2022-02-12 DIAGNOSIS — I2510 Atherosclerotic heart disease of native coronary artery without angina pectoris: Secondary | ICD-10-CM | POA: Diagnosis not present

## 2022-02-12 DIAGNOSIS — D62 Acute posthemorrhagic anemia: Secondary | ICD-10-CM | POA: Diagnosis not present

## 2022-02-12 DIAGNOSIS — Z79899 Other long term (current) drug therapy: Secondary | ICD-10-CM | POA: Diagnosis not present

## 2022-02-12 HISTORY — PX: PERIPHERAL VASCULAR INTERVENTION: CATH118257

## 2022-02-12 HISTORY — PX: PERIPHERAL VASCULAR ATHERECTOMY: CATH118256

## 2022-02-12 HISTORY — PX: ABDOMINAL AORTOGRAM W/LOWER EXTREMITY: CATH118223

## 2022-02-12 LAB — POCT I-STAT 7, (LYTES, BLD GAS, ICA,H+H)
Acid-base deficit: 4 mmol/L — ABNORMAL HIGH (ref 0.0–2.0)
Bicarbonate: 22.5 mmol/L (ref 20.0–28.0)
Calcium, Ion: 1.15 mmol/L (ref 1.15–1.40)
HCT: 30 % — ABNORMAL LOW (ref 36.0–46.0)
Hemoglobin: 10.2 g/dL — ABNORMAL LOW (ref 12.0–15.0)
O2 Saturation: 91 %
Potassium: 3.7 mmol/L (ref 3.5–5.1)
Sodium: 141 mmol/L (ref 135–145)
TCO2: 24 mmol/L (ref 22–32)
pCO2 arterial: 46.7 mmHg (ref 32–48)
pH, Arterial: 7.29 — ABNORMAL LOW (ref 7.35–7.45)
pO2, Arterial: 69 mmHg — ABNORMAL LOW (ref 83–108)

## 2022-02-12 LAB — POCT ACTIVATED CLOTTING TIME
Activated Clotting Time: 281 seconds
Activated Clotting Time: 293 seconds
Activated Clotting Time: 245 seconds

## 2022-02-12 LAB — SARS CORONAVIRUS 2 BY RT PCR (HOSPITAL ORDER, PERFORMED IN ~~LOC~~ HOSPITAL LAB): SARS Coronavirus 2: NEGATIVE

## 2022-02-12 SURGERY — ABDOMINAL AORTOGRAM W/LOWER EXTREMITY
Anesthesia: LOCAL

## 2022-02-12 MED ORDER — MOMETASONE FURO-FORMOTEROL FUM 200-5 MCG/ACT IN AERO
2.0000 | INHALATION_SPRAY | Freq: Two times a day (BID) | RESPIRATORY_TRACT | Status: DC
Start: 2022-02-12 — End: 2022-02-14
  Administered 2022-02-13 – 2022-02-14 (×3): 2 via RESPIRATORY_TRACT
  Filled 2022-02-12: qty 8.8

## 2022-02-12 MED ORDER — LIDOCAINE HCL (PF) 1 % IJ SOLN
INTRAMUSCULAR | Status: AC
  Filled 2022-02-12: qty 30

## 2022-02-12 MED ORDER — HEPARIN SODIUM (PORCINE) 1000 UNIT/ML IJ SOLN
INTRAMUSCULAR | Status: AC
  Filled 2022-02-12: qty 10

## 2022-02-12 MED ORDER — HYDRALAZINE HCL 20 MG/ML IJ SOLN: 5.0000 mg | INTRAMUSCULAR | Status: DC | PRN

## 2022-02-12 MED ORDER — HEPARIN (PORCINE) IN NACL 1000-0.9 UT/500ML-% IV SOLN
INTRAVENOUS | Status: DC | PRN
  Administered 2022-02-12 (×3): 500 mL

## 2022-02-12 MED ORDER — DOPAMINE-DEXTROSE 3.2-5 MG/ML-% IV SOLN
INTRAVENOUS | Status: AC
  Filled 2022-02-12: qty 250

## 2022-02-12 MED ORDER — ALBUTEROL SULFATE (2.5 MG/3ML) 0.083% IN NEBU: 2.5000 mg | INHALATION_SOLUTION | RESPIRATORY_TRACT | Status: DC | PRN

## 2022-02-12 MED ORDER — ASPIRIN 81 MG PO CHEW
81.0000 mg | CHEWABLE_TABLET | Freq: Every day | ORAL | Status: DC
  Administered 2022-02-13 – 2022-02-14 (×2): 81 mg via ORAL
  Filled 2022-02-12 (×2): qty 1

## 2022-02-12 MED ORDER — ACETAMINOPHEN 500 MG PO TABS: 1000.0000 mg | ORAL_TABLET | Freq: Four times a day (QID) | ORAL | Status: DC | PRN

## 2022-02-12 MED ORDER — AMLODIPINE BESYLATE 5 MG PO TABS
5.0000 mg | ORAL_TABLET | Freq: Every day | ORAL | Status: DC
  Administered 2022-02-13: 5 mg via ORAL
  Filled 2022-02-12: qty 1

## 2022-02-12 MED ORDER — FENTANYL CITRATE (PF) 100 MCG/2ML IJ SOLN
INTRAMUSCULAR | Status: AC
  Filled 2022-02-12: qty 2

## 2022-02-12 MED ORDER — MIDAZOLAM HCL 2 MG/2ML IJ SOLN
INTRAMUSCULAR | Status: DC | PRN
  Administered 2022-02-12 (×3): 1 mg via INTRAVENOUS

## 2022-02-12 MED ORDER — CLOPIDOGREL BISULFATE 75 MG PO TABS: 75.0000 mg | ORAL_TABLET | ORAL | Status: AC

## 2022-02-12 MED ORDER — NOREPINEPHRINE 4 MG/250ML-% IV SOLN
INTRAVENOUS | Status: AC
  Filled 2022-02-12: qty 250

## 2022-02-12 MED ORDER — LOSARTAN POTASSIUM 50 MG PO TABS
50.0000 mg | ORAL_TABLET | Freq: Every day | ORAL | Status: DC
Start: 2022-02-13 — End: 2022-02-13
  Administered 2022-02-13: 50 mg via ORAL
  Filled 2022-02-12: qty 1

## 2022-02-12 MED ORDER — ALBUTEROL SULFATE HFA 108 (90 BASE) MCG/ACT IN AERS: 2.0000 | INHALATION_SPRAY | RESPIRATORY_TRACT | Status: DC | PRN

## 2022-02-12 MED ORDER — SODIUM CHLORIDE 0.9 % IV SOLN: 250.0000 mL | INTRAVENOUS | Status: DC | PRN

## 2022-02-12 MED ORDER — ONDANSETRON HCL 4 MG/2ML IJ SOLN
INTRAMUSCULAR | Status: AC
  Filled 2022-02-12: qty 2

## 2022-02-12 MED ORDER — LEVOTHYROXINE SODIUM 25 MCG PO TABS
25.0000 ug | ORAL_TABLET | Freq: Every day | ORAL | Status: DC
  Administered 2022-02-13 – 2022-02-14 (×2): 25 ug via ORAL
  Filled 2022-02-12 (×2): qty 1

## 2022-02-12 MED ORDER — SODIUM CHLORIDE 0.9 % WEIGHT BASED INFUSION: 1.0000 mL/kg/h | INTRAVENOUS | Status: DC

## 2022-02-12 MED ORDER — ASPIRIN 81 MG PO CHEW: 81.0000 mg | CHEWABLE_TABLET | ORAL | Status: AC

## 2022-02-12 MED ORDER — SODIUM CHLORIDE 0.9% FLUSH: 3.0000 mL | Freq: Two times a day (BID) | INTRAVENOUS | Status: DC

## 2022-02-12 MED ORDER — CLOPIDOGREL BISULFATE 75 MG PO TABS
75.0000 mg | ORAL_TABLET | Freq: Every day | ORAL | Status: DC
  Administered 2022-02-13 – 2022-02-14 (×2): 75 mg via ORAL
  Filled 2022-02-12 (×2): qty 1

## 2022-02-12 MED ORDER — ONDANSETRON HCL 4 MG/2ML IJ SOLN: 4.0000 mg | Freq: Four times a day (QID) | INTRAMUSCULAR | Status: DC | PRN

## 2022-02-12 MED ORDER — NOREPINEPHRINE BITARTRATE 1 MG/ML IV SOLN
INTRAVENOUS | Status: DC | PRN
  Administered 2022-02-12: 5 ug/min via INTRAVENOUS

## 2022-02-12 MED ORDER — SODIUM CHLORIDE 0.9 % IV SOLN: INTRAVENOUS | Status: AC

## 2022-02-12 MED ORDER — GABAPENTIN 600 MG PO TABS
600.0000 mg | ORAL_TABLET | Freq: Two times a day (BID) | ORAL | Status: DC
  Administered 2022-02-12 – 2022-02-14 (×4): 600 mg via ORAL
  Filled 2022-02-12 (×4): qty 1

## 2022-02-12 MED ORDER — HEPARIN (PORCINE) IN NACL 1000-0.9 UT/500ML-% IV SOLN
INTRAVENOUS | Status: AC
  Filled 2022-02-12: qty 500

## 2022-02-12 MED ORDER — PANTOPRAZOLE SODIUM 40 MG PO TBEC
40.0000 mg | DELAYED_RELEASE_TABLET | Freq: Every day | ORAL | Status: DC
  Administered 2022-02-13 – 2022-02-14 (×2): 40 mg via ORAL
  Filled 2022-02-12 (×2): qty 1

## 2022-02-12 MED ORDER — LIDOCAINE HCL (PF) 1 % IJ SOLN
INTRAMUSCULAR | Status: DC | PRN
  Administered 2022-02-12: 15 mL

## 2022-02-12 MED ORDER — FENTANYL CITRATE (PF) 100 MCG/2ML IJ SOLN
INTRAMUSCULAR | Status: DC | PRN
  Administered 2022-02-12 (×4): 25 ug via INTRAVENOUS
  Administered 2022-02-12: 50 ug via INTRAVENOUS
  Administered 2022-02-12 (×2): 25 ug via INTRAVENOUS

## 2022-02-12 MED ORDER — IODIXANOL 320 MG/ML IV SOLN
INTRAVENOUS | Status: DC | PRN
  Administered 2022-02-12: 100 mL

## 2022-02-12 MED ORDER — FENTANYL CITRATE (PF) 100 MCG/2ML IJ SOLN
25.0000 ug | Freq: Once | INTRAMUSCULAR | Status: AC
  Administered 2022-02-12: 25 ug via INTRAVENOUS

## 2022-02-12 MED ORDER — SODIUM CHLORIDE 0.9% FLUSH: 3.0000 mL | INTRAVENOUS | Status: DC | PRN

## 2022-02-12 MED ORDER — ACETAMINOPHEN 325 MG PO TABS
650.0000 mg | ORAL_TABLET | ORAL | Status: DC | PRN
  Administered 2022-02-12 – 2022-02-13 (×2): 650 mg via ORAL
  Filled 2022-02-12 (×2): qty 2

## 2022-02-12 MED ORDER — SODIUM CHLORIDE 0.9 % IV SOLN
INTRAVENOUS | Status: AC | PRN
  Administered 2022-02-12: 100 mL/h
  Administered 2022-02-12: 100 mL/h via INTRAVENOUS

## 2022-02-12 MED ORDER — DULOXETINE HCL 60 MG PO CPEP
60.0000 mg | ORAL_CAPSULE | Freq: Every day | ORAL | Status: DC
  Administered 2022-02-12 – 2022-02-14 (×3): 60 mg via ORAL
  Filled 2022-02-12 (×3): qty 1

## 2022-02-12 MED ORDER — DOPAMINE-DEXTROSE 3.2-5 MG/ML-% IV SOLN
INTRAVENOUS | Status: DC | PRN
  Administered 2022-02-12: 5 ug/kg/min via INTRAVENOUS

## 2022-02-12 MED ORDER — MONTELUKAST SODIUM 10 MG PO TABS
10.0000 mg | ORAL_TABLET | Freq: Every day | ORAL | Status: DC
  Administered 2022-02-13 – 2022-02-14 (×2): 10 mg via ORAL
  Filled 2022-02-12 (×2): qty 1

## 2022-02-12 MED ORDER — HEPARIN SODIUM (PORCINE) 1000 UNIT/ML IJ SOLN
INTRAMUSCULAR | Status: DC | PRN
  Administered 2022-02-12: 2000 [IU] via INTRAVENOUS
  Administered 2022-02-12: 6000 [IU] via INTRAVENOUS
  Administered 2022-02-12: 2000 [IU] via INTRAVENOUS
  Administered 2022-02-12 (×2): 3000 [IU] via INTRAVENOUS

## 2022-02-12 MED ORDER — ONDANSETRON HCL 4 MG/2ML IJ SOLN
INTRAMUSCULAR | Status: DC | PRN
  Administered 2022-02-12: 4 mg via INTRAVENOUS

## 2022-02-12 MED ORDER — ACETAMINOPHEN ER 650 MG PO TBCR: 1300.0000 mg | EXTENDED_RELEASE_TABLET | Freq: Three times a day (TID) | ORAL | Status: DC | PRN

## 2022-02-12 MED ORDER — MIDAZOLAM HCL 2 MG/2ML IJ SOLN
INTRAMUSCULAR | Status: AC
  Filled 2022-02-12: qty 2

## 2022-02-12 MED ORDER — ASPIRIN 81 MG PO TBEC
81.0000 mg | DELAYED_RELEASE_TABLET | Freq: Every day | ORAL | 2 refills | Status: AC
  Filled 2022-02-12: qty 30, 30d supply, fill #0

## 2022-02-12 MED ORDER — SODIUM CHLORIDE 0.9 % WEIGHT BASED INFUSION
3.0000 mL/kg/h | INTRAVENOUS | Status: AC
  Administered 2022-02-12: 3 mL/kg/h via INTRAVENOUS

## 2022-02-12 MED ORDER — HEPARIN (PORCINE) IN NACL 1000-0.9 UT/500ML-% IV SOLN
INTRAVENOUS | Status: AC
  Filled 2022-02-12: qty 1000

## 2022-02-12 MED ORDER — LABETALOL HCL 5 MG/ML IV SOLN: 10.0000 mg | INTRAVENOUS | Status: DC | PRN

## 2022-02-12 MED ORDER — SODIUM CHLORIDE 0.9% FLUSH
3.0000 mL | Freq: Two times a day (BID) | INTRAVENOUS | Status: DC
  Administered 2022-02-12 – 2022-02-14 (×4): 3 mL via INTRAVENOUS

## 2022-02-12 MED ORDER — RED YEAST RICE 600 MG PO CAPS: 1.0000 | ORAL_CAPSULE | Freq: Every day | ORAL | Status: DC

## 2022-02-12 SURGICAL SUPPLY — 38 items
BALLN MUSTANG 5X100X135 (BALLOONS) ×3
BALLN MUSTANG 5X20X135 (BALLOONS) ×3
BALLN MUSTANG 6X20X135 (BALLOONS) ×3
BALLN STERLING OTW 5X220X150 (BALLOONS) ×3
BALLOON MUSTANG 5X100X135 (BALLOONS) IMPLANT
BALLOON MUSTANG 5X20X135 (BALLOONS) IMPLANT
BALLOON MUSTANG 6X20X135 (BALLOONS) IMPLANT
BALLOON STERLING OTW 5X220X150 (BALLOONS) IMPLANT
CATH ANGIO 5F PIGTAIL 65CM (CATHETERS) ×1 IMPLANT
CATH CROSS OVER TEMPO 5F (CATHETERS) ×1 IMPLANT
CATH CXI 2.3F 135 ST (CATHETERS) ×1 IMPLANT
CATH HAWKONE LS STANDARD TIP (CATHETERS) ×3
CATH HAWKONE LS STD TIP (CATHETERS) IMPLANT
CATH SOFT-VU 4F 65 STRAIGHT (CATHETERS) IMPLANT
CATH SOFT-VU ST 4F 90CM (CATHETERS) ×1 IMPLANT
CATH SOFT-VU STRAIGHT 4F 65CM (CATHETERS) ×1
CLOSURE PERCLOSE PROSTYLE (VASCULAR PRODUCTS) ×1 IMPLANT
DEVICE SPIDERFX EMB PROT 4MM (WIRE) ×1 IMPLANT
DEVICE SPIDERFX EMB PROT 6MM (WIRE) ×1 IMPLANT
GUIDEWIRE ASTATO XS 20G 300CM (WIRE) ×1 IMPLANT
KIT ANGIASSIST CO2 SYSTEM (KITS) ×1 IMPLANT
KIT ESSENTIALS PG (KITS) ×1 IMPLANT
KIT MICROPUNCTURE NIT STIFF (SHEATH) ×2 IMPLANT
KIT PV (KITS) ×3 IMPLANT
SHEATH PINNACLE 5F 10CM (SHEATH) ×1 IMPLANT
SHEATH PINNACLE 7F 10CM (SHEATH) ×1 IMPLANT
SHEATH PINNACLE ST 7F 45CM (SHEATH) ×1 IMPLANT
SHEATH PROBE COVER 6X72 (BAG) ×2 IMPLANT
STENT ELUVIA 6X150X130 (Permanent Stent) ×2 IMPLANT
STENT ELUVIA 6X60X130 (Permanent Stent) ×1 IMPLANT
STOPCOCK MORSE 400PSI 3WAY (MISCELLANEOUS) ×1 IMPLANT
TAPE SHOOT N SEE (TAPE) ×1 IMPLANT
TRANSDUCER W/STOPCOCK (MISCELLANEOUS) ×3 IMPLANT
TRAY PV CATH (CUSTOM PROCEDURE TRAY) ×3 IMPLANT
TUBING CIL FLEX 10 FLL-RA (TUBING) ×1 IMPLANT
WIRE HI TORQ COMMND ES.014X300 (WIRE) ×2 IMPLANT
WIRE HI TORQ VERSACORE J 260CM (WIRE) ×1 IMPLANT
WIRE HITORQ VERSACORE ST 145CM (WIRE) ×1 IMPLANT

## 2022-02-12 NOTE — H&P (Signed)
OV 01/24/2022 copied for documentation      Patient referred by Dr. Jilda Panda for coronary artery disease, carotid bruit  Subjective:   Monica Park, female    DOB: 01/18/1956, 66 y.o.   MRN: [id number]    Chief Complaint  Patient presents with   Follow-up   PAD     HPI  66 y.o. Caucasian female with hypertension, hyperlipidemia, hypothyroidism, multiple sclerosis, referred for management of CAD, carotid bruit.  Patient quit smoking in July 2022.  Since last seen in our office 09/17/2021 by Dr. Virgina Jock at which time shared decision was to proceed with peripheral angiography and possible intervention.  However preprocedural laboratory testing showed elevated creatinine, therefore shared decision was to postpone peripheral angiogram and reduce losartan from 50 mg to 25 mg daily.  Also added amlodipine 5 mg for hypertension control.  Creatinine has improved since last office visit with follow-up with nephrologist, labs below for reference.  Patient now presents for follow of PAD.  Since last office visit patient's claudication symptoms have worsened.  She is now struggling to walk up the stairs in her home without having to stop due to claudication.  Leg pain is bilateral, however worse in her left leg.  Fortunately she does not have any knee nonhealing wounds or ulcers and no resting leg pain.  Blood pressure is well controlled.  She is continue to refrain from smoking since June 25, 2021.   Initial consultation HPI 02/2021: Three vessel coronary atherosclerosis was noted on lung cancer screening CT chest in 07/2020. She moved from [place] to be close to her ailing stepfather, in 2020. She has minimal mobility limitations from a probable diagnosis of multiple sclerosis. She walks 5,000-10,000 steps/day. She denies chest pain, shortness of breath, palpitations, leg edema, orthopnea, PND, TIA/syncope. She does endorse pain in her left calf on walking, gets better with rest.   She is  currently smoking 6-8 cigarettes/day, down from 1 pack/day. She is hoping to quit soon on her own.    Current Outpatient Medications on File Prior to Visit  Medication Sig Dispense Refill   albuterol (VENTOLIN HFA) 108 (90 Base) MCG/ACT inhaler Inhale 2 puffs into the lungs every 4 (four) hours.     amLODipine (NORVASC) 5 MG tablet Take 1 tablet (5 mg total) by mouth daily. 30 tablet 3   BIOTIN PO Take 10,000 mcg by mouth daily.     budesonide-formoterol (SYMBICORT) 160-4.5 MCG/ACT inhaler Inhale 2 puffs into the lungs 2 (two) times daily.     cetirizine (ZYRTEC) 10 MG tablet Take 10 mg by mouth daily.     Cholecalciferol (VITAMIN D3 PO) Take 50,000 Units by mouth once a week.     cilostazol (PLETAL) 100 MG tablet TAKE 1 TABLET(100 MG) BY MOUTH TWICE DAILY 60 tablet 2   clopidogrel (PLAVIX) 75 MG tablet TAKE 1 TABLET(75 MG) BY MOUTH DAILY 90 tablet 3   DULoxetine (CYMBALTA) 60 MG capsule Take 1 capsule (60 mg total) by mouth daily. 30 capsule 5   gabapentin (NEURONTIN) 600 MG tablet Take 1 tablet (600 mg total) by mouth 3 (three) times daily. 90 tablet 11   levothyroxine (SYNTHROID) 25 MCG tablet Take 25 mcg by mouth daily.     losartan (COZAAR) 50 MG tablet Take 25 mg by mouth daily.     montelukast (SINGULAIR) 10 MG tablet Take 10 mg by mouth daily.     pantoprazole (PROTONIX) 40 MG tablet Take 40 mg by mouth daily.  Probiotic Product (HEALTHY COLON PO) Take 1 Dose by mouth daily.     Red Yeast Rice 600 MG CAPS Take 1 capsule by mouth daily.     No current facility-administered medications on file prior to visit.    Cardiovascular and other pertinent studies: EKG 01/24/2022:  Sinus rhythm at a rate of 79 bpm.  Normal axis.  Nonspecific T wave abnormality.  No evidence of ischemia or underlying injury pattern.  Renal artery duplex 01/08/2022: 1. Technically challenging examination secondary to patient body habitus. CT or MR arteriogram may be more sensitive for the detection of renal  artery stenosis. 2. No convincing evidence of hemodynamically significant renal artery stenosis by duplex ultrasound. 3. Renal size discrepancy. The left kidney is smaller than the right.  Carotid artery duplex 10/04/2021:  Duplex suggests stenosis in the right internal carotid artery (16-49%).  Duplex suggests stenosis in the right external carotid artery (<50%).  Heterogeneous plaque noted.  Duplex suggests stenosis in the left internal carotid artery (50-69%).  Duplex suggests stenosis in the left external carotid artery (<50%).  Homogeneous plaque noted.  Antegrade right vertebral artery flow. Antegrade left vertebral artery flow.  Compared to the study done on 03/27/2021, right ICA stenosis severity has reduced from 50 to 69% to the present less than 50%. Follow up in six months is appropriate if clinically indicated.  EKG 05/31/2021: Sinus rhythm 64 bpm Normal EKG  ABI 02/07/2021:  This exam reveals mildly decreased perfusion of the right lower extremity,  noted at the post tibial artery level (ABI 0.92) and moderately decreased  perfusion of the left lower extremity, noted at the anterior tibial and  post tibial artery level (ABI 0.62).  Consider complete lower extremity arterial duplex if clinically indicated.   Echocardiogram 02/07/2021:  1. Normal LV systolic function with visual EF 55-60%. Left ventricle  cavity is normal in size. Mild left ventricular hypertrophy. Normal global  wall motion. Indeterminate diastolic filling pattern, normal LAP.  2. Left atrial cavity is mildly dilated.  3. Mild (Grade I) mitral regurgitation.  4. IVC is dilated with respiratory variation.  5. No prior study for comparison.  EKG 01/04/2021: Sinus rhythm 56 bpm Cannot exclude old anteroseptal infarct  CT Chest 08/15/2020: 1. Lung-RADS 2, benign appearance or behavior. Continue annual screening with low-dose chest CT without contrast in 12 months. 2. Three-vessel coronary atherosclerosis. 3.  Aortic Atherosclerosis (ICD10-I70.0) and Emphysema (ICD10-J43.9).   Recent labs: 12/26/2021: BUN 20, creatinine 1.46, GFR 39, sodium 138, potassium 4.3 Hgb 11.4, HCT 35.4, MCV 92, platelet 250  12/03/2021: BUN 20, creatinine 1.67, GFR 34, sodium 143, potassium 5.2  09/04/2021: Chol 170, TG 129, HDL 45, LDL 102  04/24/2021: Chol 220, TG 124, HDL 49, LDL 149  07/27/2020, 11/03/2020 Glucose 83, BUN/Cr 16/1.25. EGFR 45. Na/K 142/5.2 . Rest of the CMP normal H/H 12.9/39.7. MCV 95.4. Platelets 182 HbA1C N/A Chol 241, TG 123, HDL 51, LDL 165 TSH 1.79 normal   Review of Systems  Cardiovascular:  Positive for claudication. Negative for chest pain, dyspnea on exertion, leg swelling, palpitations and syncope.  Musculoskeletal:        B/l feet pain       Vitals:   01/24/22 0949 01/24/22 0950  BP:    Pulse:    Resp:    Temp:    SpO2: 91% 99%    Body mass index is 38.61 kg/m. Filed Weights   01/24/22 0939  Weight: 232 lb (105.2 kg)     Objective:  Physical Exam Vitals and nursing note reviewed.  Constitutional:      General: She is not in acute distress. Neck:     Vascular: No JVD.  Cardiovascular:     Rate and Rhythm: Normal rate and regular rhythm.     Pulses:          Femoral pulses are 2+ on the right side and 2+ on the left side.      Popliteal pulses are 0 on the right side and 0 on the left side.       Dorsalis pedis pulses are 0 on the right side and 0 on the left side.       Posterior tibial pulses are 0 on the right side and 0 on the left side.     Heart sounds: Normal heart sounds. No murmur heard. Pulmonary:     Effort: Pulmonary effort is normal.     Breath sounds: Normal breath sounds. No wheezing or rales.  Musculoskeletal:     Right lower leg: No edema.     Left lower leg: No edema.  Feet:     Right foot:     Skin integrity: Dry skin present. No ulcer.     Toenail Condition: Right toenails are abnormally thick.     Left foot:     Skin integrity:  Dry skin present. No ulcer.     Toenail Condition: Left toenails are abnormally thick.  Physical exam unchanged compared to previous office visit.    Assessment & Recommendations:   66 y.o. Caucasian female with hypertension, hyperlipidemia, hypothyroidism, multiple sclerosis, CAD, PAD, b/l mod carotid stenosis   Coronary artery disease involving native coronary artery of native heart without angina pectoris Seen on CT chest 07/2020 Patient continues to have mild exertional dyspnea which is stable, likely secondary to history of tobacco use She is not on aspirin as she is currently on Plavix and Pletal for PAD Patient has a history of myalgias with atorvastatin and rosuvastatin.  Also unable to tolerate Nexlizet due to nausea At last office visit had added simvastatin, patient again developed myalgias.  Could consider Repatha at next office visit.  PAD (peripheral artery disease) (HCC) Rt ABI 0.82, Lt ABI 0.62. Rutherford class III lifestyle limiting claudication.   Patient denies rest pain.  There are no ulcers or wounds.  No evidence of critical limb ischemia. Continue Plavix and Pletal Again discussed indication, risk, benefits of lower extremity angiography and possible intervention.  Given the patient's creatinine has improved I discussed procedure with Dr. Virgina Jock and he feels would be appropriate to proceed with this if patient is willing.  Patient verbalized understanding of the risks and wishes to proceed.  We will plan to do CO2 lower extremity angiography with minimal use of contrast for intervention. Will repeat BMP and CBC prior to procedure.  Hypertension: Blood pressure is well controlled Continue losartan and amlodipine  Carotid artery disease: Asymptomatic, moderate, bilateral  Mixed hyperlipidemia Unable to tolerate simvastatin, atorvastatin, rosuvastatin, nexlizet  Will consider addition of Repatha at next office visit  Follow-up after peripheral  angiography  Patient was seen in collaboration with Dr. Virgina Jock and he is in agreement with the plan.    Alethia Berthold, PA-C 01/24/2022, 12:45 PM Office: [phone]

## 2022-02-12 NOTE — Progress Notes (Signed)
Pt admitted to rm 14 from cath lab. CHG wipe given. Initiated tele. Oriented pt to the unit. VSS. Call bell within reach.   Lavenia Atlas, RN

## 2022-02-12 NOTE — Interval H&P Note (Signed)
History and Physical Interval Note:  02/12/2022 7:43 AM  Monica Park  has presented today for surgery, with the diagnosis of PAD.  The various methods of treatment have been discussed with the patient and family. After consideration of risks, benefits and other options for treatment, the patient has consented to  Procedure(s): ABDOMINAL AORTOGRAM W/LOWER EXTREMITY (N/A) as a surgical intervention.  The patient's history has been reviewed, patient examined, no change in status, stable for surgery.  I have reviewed the patient's chart and labs.  Questions were answered to the patient's satisfaction.     Viktoriya Glaspy J Maudine Kluesner

## 2022-02-12 NOTE — CV Procedure (Addendum)
Primary operator Dr. Carles Collet Assist Dr. Yates Decamp  I was present during patient's procedure.  I was called to assist in view of extremely difficult and complex long segment occlusion of the left SFA for crossing the lesion.  With the help of CXI catheter and combination of Astato and command ES guidewire and also CXI support catheter , I was able to cross the CTO left distal SFA and into the true lumen.  Rest of the procedure was completed by primary operator Dr. Rosemary Holms.  Complete revascularization of the CTO long segment left SFA with excellent results.  There was no immediate complication.  I spent a total of 45 minutes during assist of the procedure.   Yates Decamp, MD, [facility] 02/12/2022, 1:53 PM Office: [phone] Fax: [phone] Pager: [phone]

## 2022-02-12 NOTE — CV Procedure (Signed)
Lt SFA CTO Directional atherectomy and overlapping stents Intermittent hypotension throughout the case likely due to vagal response No major bleeding. Constance Holster, H/H Closure with perclose Watch overnight   Nigel Mormon, MD Pager: [phone] Office: [phone]

## 2022-02-13 ENCOUNTER — Other Ambulatory Visit: Payer: Self-pay

## 2022-02-13 ENCOUNTER — Other Ambulatory Visit (HOSPITAL_COMMUNITY): Payer: Self-pay

## 2022-02-13 ENCOUNTER — Encounter (HOSPITAL_COMMUNITY): Payer: Self-pay | Admitting: Cardiology

## 2022-02-13 DIAGNOSIS — I739 Peripheral vascular disease, unspecified: Secondary | ICD-10-CM | POA: Diagnosis not present

## 2022-02-13 LAB — CBC
HCT: 25.8 % — ABNORMAL LOW (ref 36.0–46.0)
Hemoglobin: 8.2 g/dL — ABNORMAL LOW (ref 12.0–15.0)
MCH: 29.9 pg (ref 26.0–34.0)
MCHC: 31.8 g/dL (ref 30.0–36.0)
MCV: 94.2 fL (ref 80.0–100.0)
Platelets: 194 10*3/uL (ref 150–400)
RBC: 2.74 MIL/uL — ABNORMAL LOW (ref 3.87–5.11)
RDW: 14.4 % (ref 11.5–15.5)
WBC: 10.3 10*3/uL (ref 4.0–10.5)
nRBC: 0 % (ref 0.0–0.2)

## 2022-02-13 LAB — COMPREHENSIVE METABOLIC PANEL
ALT: 11 U/L (ref 0–44)
AST: 14 U/L — ABNORMAL LOW (ref 15–41)
Albumin: 2.8 g/dL — ABNORMAL LOW (ref 3.5–5.0)
Alkaline Phosphatase: 69 U/L (ref 38–126)
Anion gap: 7 (ref 5–15)
BUN: 11 mg/dL (ref 8–23)
CO2: 24 mmol/L (ref 22–32)
Calcium: 8.3 mg/dL — ABNORMAL LOW (ref 8.9–10.3)
Chloride: 106 mmol/L (ref 98–111)
Creatinine, Ser: 1.19 mg/dL — ABNORMAL HIGH (ref 0.44–1.00)
GFR, Estimated: 50 mL/min — ABNORMAL LOW (ref >60)
Glucose, Bld: 99 mg/dL (ref 70–99)
Potassium: 4.2 mmol/L (ref 3.5–5.1)
Sodium: 137 mmol/L (ref 135–145)
Total Bilirubin: 0.3 mg/dL (ref 0.3–1.2)
Total Protein: 6 g/dL — ABNORMAL LOW (ref 6.5–8.1)

## 2022-02-13 LAB — LIPID PANEL
Cholesterol: 141 mg/dL (ref 0–200)
HDL: 41 mg/dL (ref >40)
LDL Cholesterol: 81 mg/dL (ref 0–99)
Total CHOL/HDL Ratio: 3.4 RATIO
Triglycerides: 94 mg/dL (ref <150)
VLDL: 19 mg/dL (ref 0–40)

## 2022-02-13 MED ORDER — SODIUM CHLORIDE 0.9 % IV SOLN: INTRAVENOUS | Status: DC

## 2022-02-13 MED ORDER — DIPHENHYDRAMINE HCL 25 MG PO CAPS: 25.0000 mg | ORAL_CAPSULE | Freq: Four times a day (QID) | ORAL | Status: DC | PRN

## 2022-02-13 MED ORDER — TRAMADOL HCL 50 MG PO TABS
50.0000 mg | ORAL_TABLET | Freq: Four times a day (QID) | ORAL | Status: DC | PRN
  Administered 2022-02-13 – 2022-02-14 (×4): 50 mg via ORAL
  Filled 2022-02-13 (×4): qty 1

## 2022-02-13 NOTE — Progress Notes (Signed)
Subjective:  Has severe bilateral hip pain. Has had difficulty ambulating. Orthostatic hypotension on walking with PT  Objective:  Vital Signs in the last 24 hours: Temp:  [97.7 F (36.5 C)-99.4 F (37.4 C)] 98.2 F (36.8 C) (02/22 1223) Pulse Rate:  [74-97] 82 (02/22 1223) Resp:  [13-18] 18 (02/22 0700) BP: (103-157)/(48-88) 111/58 (02/22 1223) SpO2:  [92 %-100 %] 95 % (02/22 1223) Weight:  [105 kg] 105 kg 02/18/23 1715)  Intake/Output from previous day: 02/18/23 0701 - 02/22 0700 In: 720.7 [P.O.:480; I.V.:240.7] Out: 700 [Urine:700]  Physical Exam Vitals and nursing note reviewed.  Constitutional:      General: She is not in acute distress.    Appearance: She is well-developed.  Neck:     Vascular: No JVD.  Cardiovascular:     Rate and Rhythm: Normal rate and regular rhythm.     Pulses: Intact distal pulses.          Popliteal pulses are 2+ on the left side.       Dorsalis pedis pulses are 2+ on the left side.       Posterior tibial pulses are 0 on the left side.     Comments: No groin hematoma/ecchymosis Abdominal:     Palpations: Abdomen is soft.  Musculoskeletal:        General: No tenderness. Normal range of motion.     Right lower leg: No edema.     Left lower leg: No edema.  Skin:    General: Skin is warm and dry.  Neurological:     Mental Status: She is alert.     Cranial Nerves: No cranial nerve deficit.    Lab Results: Reviewed and interpreted: BMP  Cardiac Studies: Peripheral intervention February 18, 2022: Patent b/l aortoiliac arteries with mild disease Flush occluded Lt SFA CTO, reconstituting in distal SFA through collaterals from profunda Two vessel below the knee run-off with occluded ATA  Complex peripheral intervention: Directional atherectomy Lt SFA     PTA and overlapping stenting Lt SFA     Eluvia drug eluting stents 6.0X150 cm, 6.0X150 cm, 6.0X60 mm     Post dilatation with 6.0 mm balloons up to 24 atm   Assessment & Recommendations:  66  y.o. Caucasian female with hypertension, hyperlipidemia, hypothyroidism, multiple sclerosis, CAD, PAD, b/l mod carotid stenosis, PAD s/p complex intervention to left SFA CTO  PAD: Complex Lt SFA CTO atherectomy, PTA and stenting  No bleeding complication 2+ Lt pop, PT pulse DAPT w/Aspirin and plavix Current hip pain unlikely to be related to any vascular complication Unable to ambulate confidently enough for discharge today  Orthostatic hypotension: Check CBC, IV fluids  Will keep overnight   Nigel Mormon, MD Pager: [phone] Office: [phone]

## 2022-02-13 NOTE — Evaluation (Signed)
Physical Therapy Evaluation Patient Details Name: Monica Park MRN: [id number] DOB: 09-Aug-1956 Today's Date: 02/13/2022  History of Present Illness  Pt is a 66 y.o. female who presented 02/12/22 with worsening claudication symptoms in bil lower extremities (L>R). S/p abdominal aortogram with lower extremity angiography 2/21. PMH: HTN, CAD, PAD, hypothyroidism, MS   Clinical Impression  Pt presents with condition above and deficits mentioned below, see PT Problem List. PTA, she was independent without an AD, living with her sister in a 2-level house with 2 STE. Pt's bedroom is on the 2nd floor, but she is able to stay downstairs if needed. Currently, pt is displaying deficits in bil lower extremity strength and AROM (L more limited than R) secondary to pain and edema. Pt also with deficits in activity tolerance and balance, mainly due to pain, requiring a RW to ambulate at this time. Pt is able to perform all functional mobility without physical assistance, except she required minA to lift her L leg onto the bed with her return to supine. Pt limited in mobility distance by a decline in her BP, see below, but pt declining symptoms, yet her status appeared to change during session and chart review reveals she was symptomatic earlier today with the mobility specialist. Will continue to follow acutely, but suspect she will quickly progress as her pain improves.  BP:  110/47 supine  105/58 sitting  91/53 standing  72/51 standing ~3 min  88/57 standing after ambulating  114/56 supine end of session     Recommendations for follow up therapy are one component of a multi-disciplinary discharge planning process, led by the attending physician.  Recommendations may be updated based on patient status, additional functional criteria and insurance authorization.  Follow Up Recommendations No PT follow up    Assistance Recommended at Discharge PRN  Patient can return home with the following  A little  help with bathing/dressing/bathroom;Assistance with cooking/housework;Assist for transportation;Help with stairs or ramp for entrance    Equipment Recommendations Rolling walker (2 wheels)  Recommendations for Other Services       Functional Status Assessment Patient has had a recent decline in their functional status and demonstrates the ability to make significant improvements in function in a reasonable and predictable amount of time.     Precautions / Restrictions Precautions Precautions: Fall;Other (comment) Precaution Comments: monitor BP (low) Restrictions Weight Bearing Restrictions: No      Mobility  Bed Mobility Overal bed mobility: Needs Assistance Bed Mobility: Supine to Sit, Sit to Supine     Supine to sit: Supervision, HOB elevated Sit to supine: Min assist   General bed mobility comments: Supervision and etxra time to come to sit R EOB using bed rails. MinA to lift L leg back onto bed with return to supine.    Transfers Overall transfer level: Needs assistance Equipment used: None Transfers: Sit to/from Stand Sit to Stand: Min guard           General transfer comment: Extra time to power up to stand, min guard for safety as mild instability noted.    Ambulation/Gait Ambulation/Gait assistance: Min guard Gait Distance (Feet): 50 Feet Assistive device: Rolling walker (2 wheels) Gait Pattern/deviations: Step-through pattern, Decreased stride length, Trunk flexed Gait velocity: reduced Gait velocity interpretation: <1.31 ft/sec, indicative of household ambulator   General Gait Details: Pt with slow gait and flexed posture secondary to pain. No LOB, min guard for safety. PT limited distance secondary to decreased BP, but pt denying symptoms yet appearing a  bit shaky and change in mental status as time in standing increased  Stairs            Wheelchair Mobility    Modified Rankin (Stroke Patients Only)       Balance Overall balance  assessment: Needs assistance Sitting-balance support: No upper extremity supported, Feet supported Sitting balance-Leahy Scale: Good Sitting balance - Comments: Able to reach down to floor without LOB   Standing balance support: No upper extremity supported, Bilateral upper extremity supported, During functional activity Standing balance-Leahy Scale: Fair Standing balance comment: Able to stand statically without UE support, reliant on RW for mobility                             Pertinent Vitals/Pain Pain Assessment Pain Assessment: Faces Faces Pain Scale: Hurts even more Pain Location: legs (L>R) Pain Descriptors / Indicators: Discomfort, Grimacing, Operative site guarding Pain Intervention(s): Limited activity within patient's tolerance, Monitored during session, Repositioned    Home Living Family/patient expects to be discharged to:: Private residence Living Arrangements: Other relatives (sister who works from home) Available Help at Discharge: Family;Available 24 hours/day Type of Home: House Home Access: Stairs to enter Entrance Stairs-Rails: None Entrance Stairs-Number of Steps: 2 Alternate Level Stairs-Number of Steps: flight Home Layout: Two level;Bed/bath upstairs;Able to live on main level with bedroom/bathroom Home Equipment: Cane - single point      Prior Function Prior Level of Function : Independent/Modified Independent;Driving             Mobility Comments: No recent falls       Hand Dominance        Extremity/Trunk Assessment   Upper Extremity Assessment Upper Extremity Assessment: Defer to OT evaluation (R UE with tremor at rest intermittently, pt reports this has been a chronic issue)    Lower Extremity Assessment Lower Extremity Assessment: LLE deficits/detail;RLE deficits/detail RLE Deficits / Details: MMT scores of 4 hip flexion and 4 knee extension; limited by pain; denied any numbness/tingling LLE Deficits / Details: Increased  weakness noted in L leg compared to R secondary to more edema and pain; limited by pain; denied any numbness/tingling    Cervical / Trunk Assessment Cervical / Trunk Assessment: Normal  Communication   Communication: No difficulties  Cognition Arousal/Alertness: Lethargic, Awake/alert Behavior During Therapy: WFL for tasks assessed/performed Overall Cognitive Status: Within Functional Limits for tasks assessed                                 General Comments: Lethargic fallsing asleep easily at start, but easily arousable        General Comments General comments (skin integrity, edema, etc.): BP: 110/47 supine 105/58 sitting 91/53 standing 72/51 standing ~3 min 88/57 standing after ambulating 114/56 supine end of session; denied symptoms throughout but per chart appeared symptomatic earlier with mobility specialist; MD and RN made aware    Exercises     Assessment/Plan    PT Assessment Patient needs continued PT services  PT Problem List Decreased strength;Decreased range of motion;Decreased activity tolerance;Decreased balance;Decreased mobility;Pain       PT Treatment Interventions DME instruction;Gait training;Stair training;Functional mobility training;Therapeutic activities;Balance training;Therapeutic exercise;Neuromuscular re-education;Patient/family education    PT Goals (Current goals can be found in the Care Plan section)  Acute Rehab PT Goals Patient Stated Goal: to improve PT Goal Formulation: With patient Time For Goal Achievement: 02/27/22 Potential to Achieve  Goals: Good    Frequency Min 3X/week     Co-evaluation               AM-PAC PT "6 Clicks" Mobility  Outcome Measure Help needed turning from your back to your side while in a flat bed without using bedrails?: A Little Help needed moving from lying on your back to sitting on the side of a flat bed without using bedrails?: A Little Help needed moving to and from a bed to a chair  (including a wheelchair)?: A Little Help needed standing up from a chair using your arms (e.g., wheelchair or bedside chair)?: A Little Help needed to walk in hospital room?: A Little Help needed climbing 3-5 steps with a railing? : A Little 6 Click Score: 18    End of Session   Activity Tolerance: Patient tolerated treatment well Patient left: in bed;with bed alarm set;with call bell/phone within reach Nurse Communication: Mobility status;Other (comment) (BP) PT Visit Diagnosis: Unsteadiness on feet (R26.81);Other abnormalities of gait and mobility (R26.89);Muscle weakness (generalized) (M62.81);Difficulty in walking, not elsewhere classified (R26.2);Pain Pain - Right/Left:  (bil) Pain - part of body: Leg    Time: 5183-3582 PT Time Calculation (min) (ACUTE ONLY): 23 min   Charges:   PT Evaluation $PT Eval Low Complexity: 1 Low PT Treatments $Therapeutic Activity: 8-22 mins        Raymond Gurney, PT, DPT Acute Rehabilitation Services  Pager: [phone] Office: [phone]   Jewel Baize 02/13/2022, 2:01 PM

## 2022-02-13 NOTE — Progress Notes (Signed)
Noticed Hb 8.2. 2 g drop since yesterday. No physical exam or angiographic signs of obvious bleeding, Could be intraprocedural blood loss, cannot exclude some degree of hematoma. However, I do not think there ia active bleeding to necessitate CT scan or blood transfusion. Spoke with patient, her sister, and Charity fundraiser. Will give IV fluids and monitor orthostatics   Elder Negus, MD Pager: [phone] Office: [phone]

## 2022-02-13 NOTE — Progress Notes (Signed)
Pt experiencing orthostatic vitals, per MD we are holding BP meds overnight, continue fluids and reevaluate in the morning.   Kalman Jewels, RN 02/13/2022 5:29 PM

## 2022-02-13 NOTE — Progress Notes (Signed)
Mobility Specialist Progress Note   02/13/22 1150  Mobility  Activity Ambulated with assistance in hallway  Level of Assistance Minimal assist, patient does 75% or more  Assistive Device Front wheel walker  Distance Ambulated (ft) 200 ft  Activity Response Tolerated fair  $Mobility charge 1 Mobility   Received pt in chair having no initial complaint and agreeable. Upon standing pt became light headed and suggested to sit, BP was taken and recorded below. After a seated rest break pt's lightheadedness subsided. Ambulation taking inc in time d/t pt c/o pain(5/10) at incision site. X4 standing rest breaks d/t a combination of fatigue and pain. Returned back to the chair w/o fault, incision site looks well and call bell was placed by side. RN notified about session.   Pre Mobility: 84 HR, 107/70 BP During Mobility: 96 HR Post Mobility: 85 HR, 125/58 BP  Frederico Hamman Mobility Specialist Phone Number [phone]

## 2022-02-13 NOTE — TOC Transition Note (Signed)
Transition of Care (TOC) - CM/SW Discharge Note Donn Pierini RN, BSN Transitions of Care Unit 4E- RN Case Manager See Treatment Team for direct phone #    Patient Details  Name: Monica Park MRN: [id number] Date of Birth: [birth year]  [facility] CM/SW Contact:  Darrold Span, RN Phone Number: 02/13/2022, 10:39 AM   Clinical Narrative:    Pt stable for transition home today, Transition of Care Department [facility]) has reviewed patient and no TOC needs have been identified at this time.   Final next level of care: Home/Self Care Barriers to Discharge: No Barriers Identified   Patient Goals and CMS Choice     Choice offered to / list presented to : NA  Discharge Placement               Home        Discharge Plan and Services                DME Arranged: N/A DME Agency: NA       HH Arranged: NA HH Agency: NA        Social Determinants of Health (SDOH) Interventions     Readmission Risk Interventions No flowsheet data found.

## 2022-02-13 NOTE — Progress Notes (Signed)
Orthostatic vitals

## 2022-02-14 ENCOUNTER — Other Ambulatory Visit (HOSPITAL_COMMUNITY): Payer: Self-pay

## 2022-02-14 ENCOUNTER — Observation Stay (HOSPITAL_COMMUNITY): Payer: Medicare Other

## 2022-02-14 ENCOUNTER — Other Ambulatory Visit: Payer: Self-pay | Admitting: Cardiology

## 2022-02-14 DIAGNOSIS — I739 Peripheral vascular disease, unspecified: Secondary | ICD-10-CM | POA: Diagnosis not present

## 2022-02-14 DIAGNOSIS — D62 Acute posthemorrhagic anemia: Secondary | ICD-10-CM

## 2022-02-14 LAB — CBC WITH DIFFERENTIAL/PLATELET
Abs Immature Granulocytes: 0.04 10*3/uL (ref 0.00–0.07)
Basophils Absolute: 0 10*3/uL (ref 0.0–0.1)
Basophils Relative: 0 %
Eosinophils Absolute: 0.8 10*3/uL — ABNORMAL HIGH (ref 0.0–0.5)
Eosinophils Relative: 7 %
HCT: 23.3 % — ABNORMAL LOW (ref 36.0–46.0)
Hemoglobin: 7.5 g/dL — ABNORMAL LOW (ref 12.0–15.0)
Immature Granulocytes: 0 %
Lymphocytes Relative: 23 %
Lymphs Abs: 2.5 10*3/uL (ref 0.7–4.0)
MCH: 30.1 pg (ref 26.0–34.0)
MCHC: 32.2 g/dL (ref 30.0–36.0)
MCV: 93.6 fL (ref 80.0–100.0)
Monocytes Absolute: 0.8 10*3/uL (ref 0.1–1.0)
Monocytes Relative: 8 %
Neutro Abs: 6.4 10*3/uL (ref 1.7–7.7)
Neutrophils Relative %: 62 %
Platelets: 175 10*3/uL (ref 150–400)
RBC: 2.49 MIL/uL — ABNORMAL LOW (ref 3.87–5.11)
RDW: 14.4 % (ref 11.5–15.5)
WBC: 10.5 10*3/uL (ref 4.0–10.5)
nRBC: 0 % (ref 0.0–0.2)

## 2022-02-14 LAB — BASIC METABOLIC PANEL
Anion gap: 7 (ref 5–15)
BUN: 14 mg/dL (ref 8–23)
CO2: 24 mmol/L (ref 22–32)
Calcium: 8.3 mg/dL — ABNORMAL LOW (ref 8.9–10.3)
Chloride: 104 mmol/L (ref 98–111)
Creatinine, Ser: 1.43 mg/dL — ABNORMAL HIGH (ref 0.44–1.00)
GFR, Estimated: 40 mL/min — ABNORMAL LOW (ref >60)
Glucose, Bld: 121 mg/dL — ABNORMAL HIGH (ref 70–99)
Potassium: 4 mmol/L (ref 3.5–5.1)
Sodium: 135 mmol/L (ref 135–145)

## 2022-02-14 MED ORDER — TRAMADOL HCL 50 MG PO TABS: 50.0000 mg | ORAL_TABLET | Freq: Four times a day (QID) | ORAL | 0 refills | Status: DC | PRN

## 2022-02-14 MED ORDER — TRAMADOL HCL 50 MG PO TABS
50.0000 mg | ORAL_TABLET | Freq: Four times a day (QID) | ORAL | 0 refills | Status: DC | PRN
  Filled 2022-02-14: qty 30, 7d supply, fill #0

## 2022-02-14 NOTE — Discharge Summary (Deleted)
Physician Discharge Summary  Patient ID: Monica Park MRN: [id number] DOB/AGE: 67-Apr-1957 67 y.o.  Admit date: 02/12/2022 Discharge date: 02/14/2022  Primary Discharge Diagnosis: Peripheral artery disease S/p intervention to left SFA with atherectomy and overlapping drug-eluting stents Hypoxia, likely chronic Emphysema Acute on chronic blood loss anemia  Secondary Discharge Diagnosis: Former smoker Mixed hyperlipidemia Coronary artery disease without angina  Hospital Course:   66 y.o. Caucasian female with hypertension, hyperlipidemia, hypothyroidism, multiple sclerosis, CAD, PAD.  Patient has been having left-sided mitten claudication for several months.  Recommended regular walking and also had a trial of Pletal, along with guideline directed medical management for risk factor modification.  Duplex ultrasound showed left worse than right peripheral artery disease, with ABI of 0.64  (05/2021 ).  She continued to have worsening symptoms in spite of medical therapy.  Abdominal and lower extremity angiography and intervention was discussed.  Patient had previously opted not to undergo this given her increased creatinine.  After improvement in creatinine and worsening symptoms, she opted to undergo the above procedure.  Patient underwent abdominal aortogram and complex intervention on left SFA CTO on 02/12/2022.  Please see below regarding details of the procedure.  Patient was orthostatic on the next day, requiring IV fluids.  Her hemoglobin did drop from 10.1-7.5 g/dL.  However, blood transfusion was not required given hemoglobin level staying >7 G/DL and no active hemodynamic compromise.  Orthostatic hypotension completely resolved on 02/14/2022.  It was felt that blood was most likely due to procedural loss, as well as probable deep hematoma in left thigh without signs of compartment syndrome.  Patient ambulated without any significant lightheadedness and severe pain.  Tramadol was  recommended for pain, as needed for next 10 days. Excellent Doppler pulse was palpable in left popliteal, left DP artery.  Left AT is occluded at baseline.  Recommend outpatient follow-up ABI as well as CBC.  On ambulation, patient was noted to have hypoxia without any symptoms of shortness of breath.  Patient is a longtime smoker, quit in 06/2021.  CT scan chest in 2021 did show emphysema.  I suspect she has underlying COPD/emphysema, which was previously undiagnosed and/or untreated.  I arrange for home oxygen for use and ambulation.  She will need outpatient pulmonary function testing.  Discharge Exam: Blood pressure 106/62, pulse 89, temperature 98.2 F (36.8 C), temperature source Oral, resp. rate 16, height 5' 5"  (1.651 m), weight 105 kg, SpO2 94 %.   Physical Exam Vitals and nursing note reviewed.  Constitutional:      General: She is not in acute distress. Neck:     Vascular: No JVD.  Cardiovascular:     Rate and Rhythm: Normal rate and regular rhythm.     Pulses:          Popliteal pulses are 2+ on the left side.       Dorsalis pedis pulses are 1+ on the right side and 0 on the left side.       Posterior tibial pulses are 2+ on the left side.     Heart sounds: Normal heart sounds. No murmur heard.    Comments: Mild tenderness in left thigh without obvious hematoma Pulmonary:     Effort: Pulmonary effort is normal.     Breath sounds: Normal breath sounds. No wheezing or rales.  Musculoskeletal:     Right lower leg: No edema.     Left lower leg: No edema.    Recommendations on discharge:  Ambulate as tolerated Anticipate  that she will develop left thigh and leg bruising and neck several days.  If no worsening pain, or new weakness, or new paresthesia, bruising is unlikely to be dangerous.  Lower extremity intervention 02/12/2022: Patent b/l aortoiliac arteries with mild disease Flush occluded Lt SFA CTO, reconstituting in distal SFA through collaterals from profunda Two  vessel below the knee run-off with occluded ATA   Complex peripheral intervention: Directional atherectomy Lt SFA     PTA and overlapping stenting Lt SFA     Eluvia drug eluting stents 6.0X150 cm, 6.0X150 cm, 6.0X60 mm     Post dilatation with 6.0 mm balloons up to 24 atm  Directional atherectomy was performed. SFA traversed using Command ES and CXI support catheter. I encountered difficulty advancing the wire and catheter at several places along the course. However, eventually we were able to traverse and re enter the lumen. I confirmed intraluminal course distally by injecting through support catheter. I then performed dierectional atherectoym with HawkOne SX cathter from prox to distal SFA with distal embolic protection with 53F Spider wire. Next, I performed PTA using 5.0X220 mm Sterling balloon. Around this time, patient became hypotensive to 60s/40s.. I took pictures along the entire length of left leg above the knee, as well immediately below the knee, pulled the sheath back to take pictures of the abdominal aorta and iliacs. There was no perforation or bleeding anywhere. Patient intermittently require IV fluids, dopamine, norepinephrine, zofran for nausea. Most likely etiology was vasovagal response with drop in HR and BP. After stabilizing the hemodynamics, I continued the rest of the case. Given diffuse areas of dissection, probably owing through itnermitten extraluminal course of the wire and catheter, I decided to perform stenting. Three overlapping Eluvia drug eluting stents, 5.0X150, 5.0X150, 5.0X60 cm were placed from distal SFA to ostial SFA. Procedure was complex due to multiple wire exchanges, long segment of total occlusion, hemodynamic changes requiring drug management, complete review for any potential bleeding complications which were fortunately ruled out.   FOLLOW UP PLANS AND APPOINTMENTS Discharge Instructions     Diet - low sodium heart healthy   Complete by: As directed     Diet - low sodium heart healthy   Complete by: As directed    Increase activity slowly   Complete by: As directed    Increase activity slowly   Complete by: As directed       Allergies as of 02/14/2022       Reactions   Statins Other (See Comments)   Leg pain   Aspirin    Causes itching in high doses   Codeine Nausea And Vomiting   Levaquin [levofloxacin] Nausea And Vomiting   Toradol [ketorolac Tromethamine] Itching   Headaches    Penicillins Nausea And Vomiting, Rash        Medication List     STOP taking these medications    aspirin 81 MG chewable tablet Replaced by: Aspirin Low Dose 81 MG EC tablet   cilostazol 100 MG tablet Commonly known as: PLETAL       TAKE these medications    acetaminophen 500 MG tablet Commonly known as: TYLENOL Take 1,000 mg by mouth every 6 (six) hours as needed for headache.   acetaminophen 650 MG CR tablet Commonly known as: TYLENOL Take 1,300 mg by mouth every 8 (eight) hours as needed (arthritis flare).   albuterol 108 (90 Base) MCG/ACT inhaler Commonly known as: VENTOLIN HFA Inhale 2 puffs into the lungs every 4 (four) hours as  needed for wheezing or shortness of breath.   amLODipine 5 MG tablet Commonly known as: NORVASC Take 1 tablet (5 mg total) by mouth daily.   ARNICA EX Apply 1 application topically 2 (two) times daily.   Aspirin Low Dose 81 MG EC tablet Generic drug: aspirin Take 1 tablet (81 mg total) by mouth daily. Replaces: aspirin 81 MG chewable tablet   BIOTIN PO Take 10,000 mcg by mouth daily.   budesonide-formoterol 160-4.5 MCG/ACT inhaler Commonly known as: SYMBICORT Inhale 2 puffs into the lungs 2 (two) times daily as needed (shortness of breath).   cephALEXin 500 MG capsule Commonly known as: KEFLEX Take 500 mg by mouth 2 (two) times daily.   cetirizine 10 MG tablet Commonly known as: ZYRTEC Take 10 mg by mouth daily.   clopidogrel 75 MG tablet Commonly known as: PLAVIX TAKE 1  TABLET(75 MG) BY MOUTH DAILY   DULoxetine 60 MG capsule Commonly known as: Cymbalta Take 1 capsule (60 mg total) by mouth daily.   gabapentin 600 MG tablet Commonly known as: NEURONTIN Take 1 tablet (600 mg total) by mouth 3 (three) times daily. What changed: when to take this   HEALTHY COLON PO Take 1 capsule by mouth daily.   levothyroxine 25 MCG tablet Commonly known as: SYNTHROID Take 25 mcg by mouth daily.   losartan 50 MG tablet Commonly known as: COZAAR Take 50 mg by mouth daily.   montelukast 10 MG tablet Commonly known as: SINGULAIR Take 10 mg by mouth daily.   pantoprazole 40 MG tablet Commonly known as: PROTONIX Take 40 mg by mouth daily.   Red Yeast Rice 600 MG Caps Take 1 capsule by mouth daily.   traMADol 50 MG tablet Commonly known as: Ultram Take 1 tablet (50 mg total) by mouth every 6 (six) hours as needed for up to 10 days.   traMADol 50 MG tablet Commonly known as: ULTRAM Take 1 tablet (50 mg total) by mouth every 6 (six) hours as needed.   VITAMIN D3 PO Take 50,000 Units by mouth every Friday.               Durable Medical Equipment  (From admission, onward)           Start     Ordered   02/14/22 1535  For home use only DME oxygen  Once       Comments: Anticipate oxygen use on ambulation  Question Answer Comment  Length of Need 6 Months   Mode or (Route) Nasal cannula   Liters per Minute -2   Frequency Continuous (stationary and portable oxygen unit needed)   Oxygen delivery system Gas      02/14/22 1535               Giada Schoppe Esther Hardy, MD Pager: [phone] Office: [phone]

## 2022-02-14 NOTE — Progress Notes (Signed)
SATURATION QUALIFICATIONS: (This note is used to comply with regulatory documentation for home oxygen)  Patient Saturations on Room Air at Rest = 86%  Patient Saturations on Room Air while Ambulating = 80%  Patient Saturations on 2 Liters of oxygen while Ambulating = 87%  Please briefly explain why patient needs home oxygen: Pt's SpO2 would decrease to the 80s% when on RA, but would rebound back to >/= 90s% with resting and pursed lip breathing on RA. Pt's SpO2 remained more consistently higher in the high 80s% when on 2L of O2 when ambulating.   Moishe Spice, PT, DPT Acute Rehabilitation Services  Pager: [phone] Office: [phone]

## 2022-02-14 NOTE — Discharge Summary (Signed)
Physician Discharge Summary  Patient ID: Monica Park MRN: [id number] DOB/AGE: [birth year] 66 y.o.  Admit date: 02/12/2022 Discharge date: 02/14/2022  Primary Discharge Diagnosis: Peripheral artery disease S/p intervention to left SFA with atherectomy and overlapping drug-eluting stents Hypoxia, likely chronic Emphysema Acute on chronic blood loss anemia   Secondary Discharge Diagnosis: Former smoker Mixed hyperlipidemia Coronary artery disease without angina     Hospital Course:   66 y.o. Caucasian female with hypertension, hyperlipidemia, hypothyroidism, multiple sclerosis, CAD, PAD.   Patient has been having left-sided mitten claudication for several months.  Recommended regular walking and also had a trial of Pletal, along with guideline directed medical management for risk factor modification.  Duplex ultrasound showed left worse than right peripheral artery disease, with ABI of 0.64  (05/2021 ).  She continued to have worsening symptoms in spite of medical therapy.  Abdominal and lower extremity angiography and intervention was discussed.  Patient had previously opted not to undergo this given her increased creatinine.  After improvement in creatinine and worsening symptoms, she opted to undergo the above procedure.   Patient underwent abdominal aortogram and complex intervention on left SFA CTO on 02/12/2022.  Please see below regarding details of the procedure.  Patient was orthostatic on the next day, requiring IV fluids.  Her hemoglobin did drop from 10.1-7.5 g/dL.  However, blood transfusion was not required given hemoglobin level staying >7 G/DL and no active hemodynamic compromise.  Orthostatic hypotension completely resolved on 02/14/2022.  It was felt that blood was most likely due to procedural loss, as well as probable deep hematoma in left thigh without signs of compartment syndrome.  Patient ambulated without any significant lightheadedness and severe pain.  Tramadol was  recommended for pain, as needed for next 10 days. Excellent Doppler pulse was palpable in left popliteal, left DP artery.  Left AT is occluded at baseline.  Recommend outpatient follow-up ABI as well as CBC.   On ambulation, patient was noted to have hypoxia without any symptoms of shortness of breath.  Patient is a longtime smoker, quit in 06/2021.  CT scan chest in 2021 did show emphysema.  I suspect she has underlying COPD/emphysema, which was previously undiagnosed and/or untreated.  I arrange for home oxygen for use and ambulation.  She will need outpatient pulmonary function testing   Discharge Exam: Blood pressure 106/62, pulse 89, temperature 98.2 F (36.8 C), temperature source Oral, resp. rate 16, height 5' 5"  (1.651 m), weight 105 kg, SpO2 94 %.       Physical Exam Vitals and nursing note reviewed.  Constitutional:      General: She is not in acute distress. Neck:     Vascular: No JVD.  Cardiovascular:     Rate and Rhythm: Normal rate and regular rhythm.     Pulses:          Femoral pulses are 2+ on the right side and 2+ on the left side.      Popliteal pulses are 1+ on the right side and 2+ on the left side.       Dorsalis pedis pulses are 0 on the left side.       Posterior tibial pulses are 2+ on the left side.     Heart sounds: Normal heart sounds. No murmur heard.    Comments: Mild tenderness in left thigh without obvious hematoma Pulmonary:     Effort: Pulmonary effort is normal.     Breath sounds: Normal breath sounds. No wheezing or rales.  Recommendations on discharge:  Ambulate as tolerated Anticipate that she will develop left thigh and leg bruising and neck several days.  If no worsening pain, or new weakness, or new paresthesia, bruising is unlikely to be dangerous.   Lower extremity intervention 02/12/2022: Patent b/l aortoiliac arteries with mild disease Flush occluded Lt SFA CTO, reconstituting in distal SFA through collaterals from profunda Two  vessel below the knee run-off with occluded ATA   Complex peripheral intervention: Directional atherectomy Lt SFA     PTA and overlapping stenting Lt SFA     Eluvia drug eluting stents 6.0X150 cm, 6.0X150 cm, 6.0X60 mm     Post dilatation with 6.0 mm balloons up to 24 atm   Directional atherectomy was performed. SFA traversed using Command ES and CXI support catheter. I encountered difficulty advancing the wire and catheter at several places along the course. However, eventually we were able to traverse and re enter the lumen. I confirmed intraluminal course distally by injecting through support catheter. I then performed dierectional atherectoym with HawkOne SX cathter from prox to distal SFA with distal embolic protection with 41F Spider wire. Next, I performed PTA using 5.0X220 mm Sterling balloon. Around this time, patient became hypotensive to 60s/40s.. I took pictures along the entire length of left leg above the knee, as well immediately below the knee, pulled the sheath back to take pictures of the abdominal aorta and iliacs. There was no perforation or bleeding anywhere. Patient intermittently require IV fluids, dopamine, norepinephrine, zofran for nausea. Most likely etiology was vasovagal response with drop in HR and BP. After stabilizing the hemodynamics, I continued the rest of the case. Given diffuse areas of dissection, probably owing through itnermitten extraluminal course of the wire and catheter, I decided to perform stenting. Three overlapping Eluvia drug eluting stents, 5.0X150, 5.0X150, 5.0X60 cm were placed from distal SFA to ostial SFA. Procedure was complex due to multiple wire exchanges, long segment of total occlusion, hemodynamic changes requiring drug management, complete review for any potential bleeding complications which were fortunately ruled out.   FOLLOW UP PLANS AND APPOINTMENTS Discharge Instructions     Diet - low sodium heart healthy   Complete by: As directed     Diet - low sodium heart healthy   Complete by: As directed    Increase activity slowly   Complete by: As directed    Increase activity slowly   Complete by: As directed       Allergies as of 02/14/2022       Reactions   Statins Other (See Comments)   Leg pain   Aspirin    Causes itching in high doses   Codeine Nausea And Vomiting   Levaquin [levofloxacin] Nausea And Vomiting   Toradol [ketorolac Tromethamine] Itching   Headaches    Penicillins Nausea And Vomiting, Rash        Medication List     STOP taking these medications    aspirin 81 MG chewable tablet Replaced by: Aspirin Low Dose 81 MG EC tablet   cilostazol 100 MG tablet Commonly known as: PLETAL       TAKE these medications    acetaminophen 500 MG tablet Commonly known as: TYLENOL Take 1,000 mg by mouth every 6 (six) hours as needed for headache.   acetaminophen 650 MG CR tablet Commonly known as: TYLENOL Take 1,300 mg by mouth every 8 (eight) hours as needed (arthritis flare).   albuterol 108 (90 Base) MCG/ACT inhaler Commonly known as: VENTOLIN HFA Inhale  2 puffs into the lungs every 4 (four) hours as needed for wheezing or shortness of breath.   amLODipine 5 MG tablet Commonly known as: NORVASC Take 1 tablet (5 mg total) by mouth daily.   ARNICA EX Apply 1 application topically 2 (two) times daily.   Aspirin Low Dose 81 MG EC tablet Generic drug: aspirin Take 1 tablet (81 mg total) by mouth daily. Replaces: aspirin 81 MG chewable tablet   BIOTIN PO Take 10,000 mcg by mouth daily.   budesonide-formoterol 160-4.5 MCG/ACT inhaler Commonly known as: SYMBICORT Inhale 2 puffs into the lungs 2 (two) times daily as needed (shortness of breath).   cephALEXin 500 MG capsule Commonly known as: KEFLEX Take 500 mg by mouth 2 (two) times daily.   cetirizine 10 MG tablet Commonly known as: ZYRTEC Take 10 mg by mouth daily.   clopidogrel 75 MG tablet Commonly known as: PLAVIX TAKE 1  TABLET(75 MG) BY MOUTH DAILY   DULoxetine 60 MG capsule Commonly known as: Cymbalta Take 1 capsule (60 mg total) by mouth daily.   gabapentin 600 MG tablet Commonly known as: NEURONTIN Take 1 tablet (600 mg total) by mouth 3 (three) times daily. What changed: when to take this   HEALTHY COLON PO Take 1 capsule by mouth daily.   levothyroxine 25 MCG tablet Commonly known as: SYNTHROID Take 25 mcg by mouth daily.   losartan 50 MG tablet Commonly known as: COZAAR Take 50 mg by mouth daily.   montelukast 10 MG tablet Commonly known as: SINGULAIR Take 10 mg by mouth daily.   pantoprazole 40 MG tablet Commonly known as: PROTONIX Take 40 mg by mouth daily.   Red Yeast Rice 600 MG Caps Take 1 capsule by mouth daily.   traMADol 50 MG tablet Commonly known as: Ultram Take 1 tablet (50 mg total) by mouth every 6 (six) hours as needed for up to 10 days.   traMADol 50 MG tablet Commonly known as: ULTRAM Take 1 tablet (50 mg total) by mouth every 6 (six) hours as needed.   VITAMIN D3 PO Take 50,000 Units by mouth every Friday.               Durable Medical Equipment  (From admission, onward)           Start     Ordered   02/14/22 1535  For home use only DME oxygen  Once       Comments: Anticipate oxygen use on ambulation  Question Answer Comment  Length of Need 6 Months   Mode or (Route) Nasal cannula   Liters per Minute -2   Frequency Continuous (stationary and portable oxygen unit needed)   Oxygen delivery system Gas      02/14/22 1535               Monica Haliburton Esther Hardy, MD Pager: [phone] Office: [phone]

## 2022-02-14 NOTE — Progress Notes (Signed)
Mobility Specialist Progress Note   02/14/22 1100  Mobility  Activity Ambulated with assistance in room;Transferred from chair to bed  Level of Assistance Minimal assist, patient does 75% or more  Assistive Device Front wheel walker  Distance Ambulated (ft) 20 ft  Activity Response Tolerated fair  $Mobility charge 1 Mobility   Received in chair requesting to return to bed but agreeable to a short mobility session before getting back in. Pt presented on 3LO2 at 100% SpO2 w/ pain(7/10) in LLE. Ambulation in room limited to pain in LLE that steadily inc'd. Pt having an antalgic like gait when approaching bed. Returned BTB w/ same pain expressed. Call bell in reach, bed alarm on and RN notified. Pt on RA after sitting at 95% SpO2.    Pre Mobility: 87 HR, 100% SpO2 on 3LO2 During Mobility: 99 HR, 89% SpO2 on RA  Post Mobility: 84 HR, 95% SpO2 on RA   Frederico Hamman Mobility Specialist Phone Number [phone]

## 2022-02-14 NOTE — Evaluation (Signed)
Occupational Therapy Evaluation Patient Details Name: Monica Park MRN: [id number] DOB: February 23, 1956 Today's Date: 02/14/2022   History of Present Illness Pt is a 66 y.o. female who presented 02/12/22 with worsening claudication symptoms in bil lower extremities (L>R). S/p abdominal aortogram with lower extremity angiography 2/21. PMH: HTN, CAD, PAD, hypothyroidism, MS   Clinical Impression    Pt admitted as above seen today for OT assessment and retraining session with focus on functional mobility and transfers, as well as ADL's. She is currently supervision - min A for ADL's and functional mobility tasks secondary to L LE pain ambulating in room with RW. Pt participated in ADL retraining session standing at sink for grooming, simulated toilet transfer to 3:1. She is Min guard-min A for initial sit to stand from EOB, but supervision only when transfering to chair/3:1 after completion of ADL's. Pt reports that she plans to d/c to a friends house whom has a 1 story home with a level entrance. She plans to d/c home later today and states that family and friend can provide PRN assist at d/c. She should benefit from a 3:1. Will sign off acute OT at this time.     Recommendations for follow up therapy are one component of a multi-disciplinary discharge planning process, led by the attending physician.  Recommendations may be updated based on patient status, additional functional criteria and insurance authorization.   Follow Up Recommendations  No OT follow up ; 3:1   Assistance Recommended at Discharge Intermittent Supervision/Assistance  Patient can return home with the following A little help with walking and/or transfers;A little help with bathing/dressing/bathroom;Assistance with cooking/housework    Functional Status Assessment  Patient has had a recent decline in their functional status and demonstrates the ability to make significant improvements in function in a reasonable and predictable  amount of time.   Equipment Recommendations  BSC/3in1    Recommendations for Other Services       Precautions / Restrictions Precautions Precautions: Fall;Other (comment) Precaution Comments: Monitor BP      Mobility Bed Mobility Overal bed mobility: Needs Assistance Bed Mobility: Supine to Sit     Supine to sit: HOB elevated, Modified independent (Device/Increase time)     General bed mobility comments: No hands on assist with supine to sit up at EOB, HOB elevated    Transfers Overall transfer level: Needs assistance Equipment used: Rolling walker (2 wheels) Transfers: Sit to/from Stand Sit to Stand: Min guard     General transfer comment: Extra time to power up to stand, min guard for safety      Balance Overall balance assessment: Needs assistance Sitting-balance support: No upper extremity supported, Feet supported Sitting balance-Leahy Scale: Good     Standing balance support: No upper extremity supported, Bilateral upper extremity supported, During functional activity Standing balance-Leahy Scale: Fair Standing balance comment: Able to stand statically without UE support for ADL's at sink, reliant on RW for mobility       ADL either performed or assessed with clinical judgement   ADL Overall ADL's : Needs assistance/impaired Eating/Feeding: Independent;Sitting   Grooming: Wash/dry hands;Wash/dry face;Oral care;Applying deodorant;Standing;Supervision/safety Grooming Details (indicate cue type and reason): Standing at sink for grooming tasks today at supervision level secondary to LLE pain/c/o. RN gave pain medication prior to getting up OOB Upper Body Bathing: Supervision/ safety;Standing Upper Body Bathing Details (indicate cue type and reason): Pt would be Mod I sitting at sink for UB bathing Lower Body Bathing: Sitting/lateral leans;Sit to/from stand;Min guard Lower Body  Bathing Details (indicate cue type and reason): Min guard for initial sit to  stand secondary to L LE pain per pt report Upper Body Dressing : Modified independent;Sitting   Lower Body Dressing: Minimal assistance;Sitting/lateral leans;Sit to/from stand   Toilet Transfer: Supervision/safety;Ambulation;BSC/3in1;Rolling walker (2 wheels) Toilet Transfer Details (indicate cue type and reason): Pt ambulated with Supervision asssit from EOB to sink area where 3:1 was. Pt declined using the toilet or 3:1 stating that she had already gone. Toileting- Clothing Manipulation and Hygiene: Sitting/lateral lean;Sit to/from stand;Supervision/safety;Min guard       Functional mobility during ADLs: Supervision/safety;Rolling walker (2 wheels);Cueing for sequencing General ADL Comments: Pt was overall supervision - min A for ADL's and functional mobility tasks secondary to L LE pain ambulating in room with RW. Pt participated in ADL retraining session standing at sink for grooming, simulated toilet transfer to 3:1. She is Min guard-min A for initial sit to stand from EOB, but supervision only when transfering to chair/3:1 after completion of ADL's. Pt reports that she plans to d/c to a friends house whom has a 1 story home with a level entrance. She states that family and friend can provide PRN assist at d/c. She may benefit from a 3:1     Vision Baseline Vision/History: 1 Wears glasses Patient Visual Report: No change from baseline       Perception Perception Perception: Not tested   Praxis Praxis Praxis: Not tested    Pertinent Vitals/Pain Pain Assessment Pain Assessment: 0-10 Pain Score: 7  Faces Pain Scale: Hurts even more Pain Location: L LE Pain Descriptors / Indicators: Discomfort, Grimacing, Operative site guarding Pain Intervention(s): Limited activity within patient's tolerance, Monitored during session, Repositioned, Premedicated before session     Hand Dominance Right   Extremity/Trunk Assessment Upper Extremity Assessment Upper Extremity Assessment: Overall  WFL for tasks assessed   Lower Extremity Assessment Lower Extremity Assessment: Defer to PT evaluation   Cervical / Trunk Assessment Cervical / Trunk Assessment: Normal   Communication Communication Communication: No difficulties   Cognition Arousal/Alertness: Awake/alert Behavior During Therapy: WFL for tasks assessed/performed Overall Cognitive Status: Within Functional Limits for tasks assessed                 Home Living Family/patient expects to be discharged to:: Private residence (Pt plans to d/c home with a friend whom has a level entry and 1 story home. Per pt, friend can provide PRN assist) Living Arrangements: Other relatives (Sister works from home) Available Help at Discharge: Family;Available 24 hours/day Type of Home: House Home Access: Stairs to enter [employer] of Steps: 2 Entrance Stairs-Rails: None Home Layout: Two level;Bed/bath upstairs;Able to live on main level with bedroom/bathroom Alternate Level Stairs-Number of Steps: flight   Bathroom Shower/Tub: Chief Strategy Officer: Standard     Home Equipment: Cane - single point   Additional Comments: Pt plans to stay with a friend at d/c that has a comfort height toilet and level entry home all on 1 level.      Prior Functioning/Environment Prior Level of Function : Independent/Modified Independent;Driving     Mobility Comments: No recent falls ADLs Comments: Mod I ADL's and IADL's per pt report    OT Problem List: Pain      OT Treatment/Interventions:   Acute OT assessment, recommend 3:1   OT Goals(Current goals can be found in the care plan section) Acute Rehab OT Goals Patient Stated Goal: D/c home to firends house later today. Less L LE pain  OT Goal Formulation: All assessment and education complete, DC therapy Time For Goal Achievement:  (Eval only) Potential to Achieve Goals:  (Eval only)  OT Frequency:   Assessment only      AM-PAC OT "6 Clicks" Daily  Activity     Outcome Measure Help from another person eating meals?: None Help from another person taking care of personal grooming?: A Little Help from another person toileting, which includes using toliet, bedpan, or urinal?: A Little Help from another person bathing (including washing, rinsing, drying)?: A Little Help from another person to put on and taking off regular upper body clothing?: None Help from another person to put on and taking off regular lower body clothing?: A Little 6 Click Score: 20   End of Session Equipment Utilized During Treatment: Gait belt;Rolling walker (2 wheels) Nurse Communication: Mobility status;Other (comment) (Pt may benefit from 3:1 at d/c)  Activity Tolerance: Patient tolerated treatment well Patient left: in chair;with call bell/phone within reach  OT Visit Diagnosis: Pain Pain - Right/Left: Left Pain - part of body: Leg                Time: 2010-0712 OT Time Calculation (min): 40 min Charges:  OT General Charges $OT Visit: 1 Visit OT Evaluation $OT Eval Low Complexity: 1 Low OT Treatments $Self Care/Home Management : 8-22 mins  Liane Tribbey Beth Dixon, OTR/L 02/14/2022, 10:35 AM

## 2022-02-14 NOTE — TOC Transition Note (Addendum)
Transition of Care (TOC) - CM/SW Discharge Note Donn Pierini RN, BSN Transitions of Care Unit 4E- RN Case Manager See Treatment Team for direct phone #    Patient Details  Name: Monica Park MRN: [id number] Date of Birth: 1956/12/02  Transition [facility] Care [facility] CM/SW Contact:  Darrold Span, RN Phone Number: 02/14/2022, 4:18 PM   Clinical Narrative:    Received call from MD that pt will need home 02 for discharge. Orders have been placed and per PT qualifying note in. Order has also been placed for RW.   Call made to [facility] with in house provider -[employer] for home 02 needs. Once approved [employer] will deliver transport tank to room for transition home as well as RW.   Bedside RN aware   Final next level of care: Home/Self Care Barriers to Discharge: No Barriers Identified   Patient Goals and CMS Choice     Choice offered to / list presented to : NA  Discharge Placement               Home        Discharge Plan and Services   Discharge Planning Services: CM Consult Post Acute Care Choice: Durable Medical Equipment          DME Arranged: Oxygen, Walker rolling DME Agency: [facility] Date DME Agency Contacted: 02/14/22 Time DME Agency Contacted: [phone] Representative spoke with at DME Agency: Ian Malkin HH Arranged: NA HH Agency: NA        Social Determinants of Health (SDOH) Interventions     Readmission Risk Interventions No flowsheet data found.

## 2022-02-14 NOTE — Plan of Care (Signed)

## 2022-02-14 NOTE — Progress Notes (Signed)
Patient discharging home with the support of her sister. IV removed without complications. Tele removed and CCMD notified. Discharge instructions given and medication administration discussed. All questions answered. Waiting for home O2 to be delivered.  Monica Park

## 2022-02-14 NOTE — Progress Notes (Signed)
Physical Therapy Treatment Patient Details Name: Monica Park MRN: [id number] DOB: [birth year] Today's Date: 02/14/2022   History of Present Illness Pt is a 66 y.o. female who presented 02/12/22 with worsening claudication symptoms in bil lower extremities (L>R). S/p abdominal aortogram with lower extremity angiography 2/21. PMH: HTN, CAD, PAD, hypothyroidism, MS    PT Comments    Pt making good progress with mobility, ambulating further distances and navigating several stairs without LOB. Pt intermittently requiring light minA for transfers and stairs due to instability and difficulty mobilizing from L leg pain. Educated pt on frequent activity, pursed lip breathing, using pulse ox, managing supplemental O2, and proper guearding and assistance on stairs. Will continue to follow acutely. Current recommendations remain appropriate.   BP:  106/62 supine 118/56 sitting 129/68 standing 124/81 standing a few min 127/74 after gait bout  SpO2 down to 86% on RA at rest, 80% on RA with mobility and 87% on 2 L with mobility but waveforms unreliable throughout, rebounds with rested pursed lip breathing on RA     Recommendations for follow up therapy are one component of a multi-disciplinary discharge planning process, led by the attending physician.  Recommendations may be updated based on patient status, additional functional criteria and insurance authorization.  Follow Up Recommendations  No PT follow up     Assistance Recommended at Discharge PRN  Patient can return home with the following A little help with bathing/dressing/bathroom;Assistance with cooking/housework;Assist for transportation;Help with stairs or ramp for entrance   Equipment Recommendations  Rolling walker (2 wheels)    Recommendations for Other Services       Precautions / Restrictions Precautions Precautions: Fall;Other (comment) Precaution Comments: monitor SpO2 and BP Restrictions Weight Bearing Restrictions:  No     Mobility  Bed Mobility Overal bed mobility: Needs Assistance Bed Mobility: Supine to Sit, Sit to Supine     Supine to sit: Supervision, HOB elevated Sit to supine: Supervision, HOB elevated   General bed mobility comments: Supervision and extra time to manage L leg with all bed mobility, HOB elevated.    Transfers Overall transfer level: Needs assistance Equipment used: None Transfers: Sit to/from Stand Sit to Stand: Min guard, Min assist           General transfer comment: Pt requiring light minA for first rep, progressing to min guard assist with subsequent reps. Pt needing extra time and multiple attempts to come to stand due to L leg pain.    Ambulation/Gait Ambulation/Gait assistance: Min guard Gait Distance (Feet): 120 Feet (x2 bouts of ~80 ft > ~120 ft) Assistive device: Rolling walker (2 wheels), None Gait Pattern/deviations: Step-through pattern, Decreased stride length, Trunk flexed, Decreased weight shift to left, Antalgic, Decreased stance time - left Gait velocity: reduced Gait velocity interpretation: <1.31 ft/sec, indicative of household ambulator   General Gait Details: Pt with slow, antalgic gait pattern due to L leg pain, no LOB, min guard for safety. Took a few steps without UE support, no LOB.   Stairs Stairs: Yes Stairs assistance: Min guard, Min assist Stair Management: Forwards, With walker, Step to pattern Number of Stairs: 3 General stair comments: Ascending and descending with step-to pattern with 1-2 UE support on RW over top of transportable step to simulate handrails. Light minA initially but progressed to min guard assist. Educated pt on leading up with R leg and down with L for comfort and ease, good compliance.   Wheelchair Mobility    Modified Rankin (Stroke Patients Only)  Balance Overall balance assessment: Needs assistance Sitting-balance support: No upper extremity supported, Feet supported Sitting balance-Leahy  Scale: Good     Standing balance support: No upper extremity supported, Bilateral upper extremity supported, During functional activity Standing balance-Leahy Scale: Fair Standing balance comment: Able to take a couple steps without UE support but benefits from RW                            Cognition Arousal/Alertness: Awake/alert Behavior During Therapy: [facility] for tasks assessed/performed Overall Cognitive Status: Within Functional Limits for tasks assessed                                          Exercises      General Comments General comments (skin integrity, edema, etc.): BP: 106/62 supine, 118/56 sitting, 129/68 standing, 124/81 standing a few min, 127/74 after gait bout; SpO2 down to 86% on RA at rest, 80% on RA with mobility and 87% on 2 L with mobility but waveforms unreliable throughout, rebounds with rested pursed lip breathing on RA; educated pt on frequent activity, pursed lip breathing, using pulse ox, managing supplemental O2, and proper guearding and assistance on stairs      Pertinent Vitals/Pain Pain Assessment Pain Assessment: 0-10 Pain Score: 7  Pain Location: legs (L>R) Pain Descriptors / Indicators: Discomfort, Grimacing, Operative site guarding Pain Intervention(s): Monitored during session, Limited activity within patient's tolerance, Repositioned, Patient requesting pain meds-RN notified    Home Living                          Prior Function            PT Goals (current goals can now be found in the care plan section) Acute Rehab PT Goals Patient Stated Goal: to improve PT Goal Formulation: With patient Time For Goal Achievement: 02/27/22 Potential to Achieve Goals: Good Progress towards PT goals: Progressing toward goals    Frequency    Min 3X/week      PT Plan Current plan remains appropriate    Co-evaluation              AM-PAC PT "6 Clicks" Mobility   Outcome Measure  Help needed turning  from your back to your side while in a flat bed without using bedrails?: A Little Help needed moving from lying on your back to sitting on the side of a flat bed without using bedrails?: A Little Help needed moving to and from a bed to a chair (including a wheelchair)?: A Little Help needed standing up from a chair using your arms (e.g., wheelchair or bedside chair)?: A Little Help needed to walk in hospital room?: A Little Help needed climbing 3-5 steps with a railing? : A Little 6 Click Score: 18    End of Session Equipment Utilized During Treatment: Oxygen Activity Tolerance: Patient tolerated treatment well Patient left: in bed;with bed alarm set;with call bell/phone within reach Nurse Communication: Mobility status;Other (comment) (BP, sats) PT Visit Diagnosis: Unsteadiness on feet (R26.81);Other abnormalities of gait and mobility (R26.89);Muscle weakness (generalized) (M62.81);Difficulty in walking, not elsewhere classified (R26.2);Pain Pain - Right/Left:  (bil) Pain - part of body: Leg     Time: 5681-2751 PT Time Calculation (min) (ACUTE ONLY): 51 min  Charges:  $Gait Training: 38-52 mins  Raymond Gurney, PT, DPT Acute Rehabilitation Services  Pager: [phone] Office: [phone]    Jewel Baize 02/14/2022, 3:23 PM

## 2022-02-18 ENCOUNTER — Telehealth: Payer: Self-pay

## 2022-02-18 NOTE — Telephone Encounter (Signed)
Location of hospitalization: [place] Reason for hospitalization: Surgery on her leg Date of discharge: 02/14/22 Date of first communication with patient: today Person contacting patient: Marvell Fuller  Current symptoms: Patient is having difficulty sitting, walking and standing  Do you understand why you were in the Hospital: Yes Questions regarding discharge instructions: None Where were you discharged to: Home Medications reviewed: Yes Allergies reviewed: Yes Dietary changes reviewed: Yes. Discussed low fat and low salt diet.  Referals reviewed: NA Activities of Daily Living: Able to with mild limitations Any transportation issues/concerns: None Any patient concerns: None Confirmed importance & date/time of Follow up appt: Yes Confirmed with patient if condition begins to worsen call. Pt was given the office number and encouraged to call back with questions or concerns: Yes

## 2022-02-18 NOTE — Progress Notes (Addendum)
Patient referred by Dr. Jilda Panda for coronary artery disease, carotid bruit  Subjective:   Monica Park, female    DOB: [birth year], 66 y.o.   MRN: [id number]    Chief Complaint  Patient presents with   Follow-up     HPI  66 y.o. Caucasian female with hypertension, hyperlipidemia, hypothyroidism, multiple sclerosis, CAD, PAD s/p intervention to left SFA with atherectomy and overlapping drug-eluting stents. Patient quit smoking in July 2022.  Patient presents for 1 week follow-up after discharge. Patient reports left thigh pain has improved some since discharge. She is tolerating aspirin and Plavix. Patient states her ambulation is improving as well, no longer using the walker at home. She is currently taking Tylenol for pain relief associated with hematoma in the left thigh.  Hemoglobin has improved to 9.3, baseline is approximately 10-11.   Patient has previously stopped statins due to concern of memory loss, however upon further questioning memory loss seems to be more longstanding and progressive issue, not likely related to statin therapy.  Discharge summary via Dr. Virgina Jock 02/14/2022: Patient has been having left-sided intermittent claudication for several months.  Recommended regular walking and also had a trial of Pletal, along with guideline directed medical management for risk factor modification.  Duplex ultrasound showed left worse than right peripheral artery disease, with ABI of 0.64  (05/2021 ).  She continued to have worsening symptoms in spite of medical therapy.  Abdominal and lower extremity angiography and intervention was discussed.  Patient had previously opted not to undergo this given her increased creatinine.  After improvement in creatinine and worsening symptoms, she opted to undergo the above procedure.   Patient underwent abdominal aortogram and complex intervention on left SFA CTO on 02/12/2022.  Please see below regarding details of the procedure.   Patient was orthostatic on the next day, requiring IV fluids.  Her hemoglobin did drop from 10.1-7.5 g/dL.  However, blood transfusion was not required given hemoglobin level staying >7 G/DL and no active hemodynamic compromise.  Orthostatic hypotension completely resolved on 02/14/2022.  It was felt that blood was most likely due to procedural loss, as well as probable deep hematoma in left thigh without signs of compartment syndrome.  Patient ambulated without any significant lightheadedness and severe pain.  Tramadol was recommended for pain, as needed for next 10 days. Excellent Doppler pulse was palpable in left popliteal, left DP artery.  Left AT is occluded at baseline.  Recommend outpatient follow-up ABI as well as CBC.   On ambulation, patient was noted to have hypoxia without any symptoms of shortness of breath.  Patient is a longtime smoker, quit in 06/2021.  CT scan chest in 2021 did show emphysema.  I suspect she has underlying COPD/emphysema, which was previously undiagnosed and/or untreated.  I arrange for home oxygen for use and ambulation.  She will need outpatient pulmonary function testing  Since last seen in our office 09/17/2021 by Dr. Virgina Jock at which time shared decision was to proceed with peripheral angiography and possible intervention.  However preprocedural laboratory testing showed elevated creatinine, therefore shared decision was to postpone peripheral angiogram and reduce losartan from 50 mg to 25 mg daily.  Also added amlodipine 5 mg for hypertension control.  Creatinine has improved since last office visit with follow-up with nephrologist, labs below for reference.  Patient now presents for follow of PAD.  Since last office visit patient's claudication symptoms have worsened.  She is now struggling to walk up the stairs in  her home without having to stop due to claudication.  Leg pain is bilateral, however worse in her left leg.  Fortunately she does not have any knee  nonhealing wounds or ulcers and no resting leg pain.  Blood pressure is well controlled.  She is continue to refrain from smoking since June 25, 2021.   Current Outpatient Medications on File Prior to Visit  Medication Sig Dispense Refill   acetaminophen (TYLENOL) 650 MG CR tablet Take 1,300 mg by mouth daily.     albuterol (VENTOLIN HFA) 108 (90 Base) MCG/ACT inhaler Inhale 2 puffs into the lungs every 4 (four) hours as needed for wheezing or shortness of breath.     amLODipine (NORVASC) 5 MG tablet Take 1 tablet (5 mg total) by mouth daily. 30 tablet 3   aspirin 81 MG EC tablet Take 1 tablet (81 mg total) by mouth daily. 30 tablet 2   BIOTIN PO Take 10,000 mcg by mouth daily.     budesonide-formoterol (SYMBICORT) 160-4.5 MCG/ACT inhaler Inhale 2 puffs into the lungs 2 (two) times daily as needed (shortness of breath).     cetirizine (ZYRTEC) 10 MG tablet Take 10 mg by mouth daily.     Cholecalciferol (VITAMIN D3 PO) Take 50,000 Units by mouth every Friday.     clopidogrel (PLAVIX) 75 MG tablet TAKE 1 TABLET(75 MG) BY MOUTH DAILY 90 tablet 3   DULoxetine (CYMBALTA) 60 MG capsule Take 1 capsule (60 mg total) by mouth daily. 30 capsule 5   gabapentin (NEURONTIN) 600 MG tablet Take 1 tablet (600 mg total) by mouth 3 (three) times daily. (Patient taking differently: Take 600 mg by mouth 2 (two) times daily.) 90 tablet 11   levothyroxine (SYNTHROID) 25 MCG tablet Take 25 mcg by mouth daily.     losartan (COZAAR) 50 MG tablet Take 50 mg by mouth daily.     montelukast (SINGULAIR) 10 MG tablet Take 10 mg by mouth daily.     pantoprazole (PROTONIX) 40 MG tablet Take 40 mg by mouth daily.     Probiotic Product (HEALTHY COLON PO) Take 1 capsule by mouth daily.     Red Yeast Rice 600 MG CAPS Take 1 capsule by mouth daily.     cephALEXin (KEFLEX) 500 MG capsule Take 500 mg by mouth 2 (two) times daily.     Homeopathic Products (ARNICA EX) Apply 1 application topically 2 (two) times daily.     traMADol  (ULTRAM) 50 MG tablet Take 1 tablet (50 mg total) by mouth every 6 (six) hours as needed for up to 10 days. 30 tablet 0   traMADol (ULTRAM) 50 MG tablet Take 1 tablet (50 mg total) by mouth every 6 (six) hours as needed. 30 tablet 0   No current facility-administered medications on file prior to visit.    Cardiovascular and other pertinent studies: EKG 02/19/2022:  Sinus rhythm at a rate of 84 bpm.  Normal axis.  Nonspecific thromboembolic T wave abnormality.  Compared EKG 01/24/2022, no significant change.  Abdominal aortogram with lower extremity intervention 02/12/2022: Patent b/l aortoiliac arteries with mild disease Flush occluded Lt SFA CTO, reconstituting in distal SFA through collaterals from profunda Two vessel below the knee run-off with occluded ATA   Complex peripheral intervention: Directional atherectomy Lt SFA     PTA and overlapping stenting Lt SFA     Eluvia drug eluting stents 6.0X150 cm, 6.0X150 cm, 6.0X60 mm     Post dilatation with 6.0 mm balloons up to 24 atm  Renal artery duplex 01/08/2022: 1. Technically challenging examination secondary to patient body habitus. CT or MR arteriogram may be more sensitive for the detection of renal artery stenosis. 2. No convincing evidence of hemodynamically significant renal artery stenosis by duplex ultrasound. 3. Renal size discrepancy. The left kidney is smaller than the right.  Carotid artery duplex 10/04/2021:  Duplex suggests stenosis in the right internal carotid artery (16-49%).  Duplex suggests stenosis in the right external carotid artery (<50%).  Heterogeneous plaque noted.  Duplex suggests stenosis in the left internal carotid artery (50-69%).  Duplex suggests stenosis in the left external carotid artery (<50%).  Homogeneous plaque noted.  Antegrade right vertebral artery flow. Antegrade left vertebral artery flow.  Compared to the study done on 03/27/2021, right ICA stenosis severity has reduced from 50 to 69% to the  present less than 50%. Follow up in six months is appropriate if clinically indicated.  EKG 05/31/2021: Sinus rhythm 64 bpm Normal EKG  ABI 02/07/2021:  This exam reveals mildly decreased perfusion of the right lower extremity,  noted at the post tibial artery level (ABI 0.92) and moderately decreased  perfusion of the left lower extremity, noted at the anterior tibial and  post tibial artery level (ABI 0.62).  Consider complete lower extremity arterial duplex if clinically indicated.   Echocardiogram 02/07/2021:  1. Normal LV systolic function with visual EF 55-60%. Left ventricle  cavity is normal in size. Mild left ventricular hypertrophy. Normal global  wall motion. Indeterminate diastolic filling pattern, normal LAP.  2. Left atrial cavity is mildly dilated.  3. Mild (Grade I) mitral regurgitation.  4. IVC is dilated with respiratory variation.  5. No prior study for comparison.  EKG 01/04/2021: Sinus rhythm 56 bpm Cannot exclude old anteroseptal infarct  CT Chest 08/15/2020: 1. Lung-RADS 2, benign appearance or behavior. Continue annual screening with low-dose chest CT without contrast in 12 months. 2. Three-vessel coronary atherosclerosis. 3. Aortic Atherosclerosis (ICD10-I70.0) and Emphysema (ICD10-J43.9).   Recent labs: 02/18/2022:  Hgb 9.3, HCT 27.7, MCV 89, platelet 308  02/14/2022: Sodium 135, potassium 4.0, BUN 14, creatinine 1.43, GFR 40 Hgb 7.5, HCT 23.3, MCV 93.6, platelets 175  12/26/2021: BUN 20, creatinine 1.46, GFR 39, sodium 138, potassium 4.3 Hgb 11.4, HCT 35.4, MCV 92, platelet 250  12/03/2021: BUN 20, creatinine 1.67, GFR 34, sodium 143, potassium 5.2  09/04/2021: Chol 170, TG 129, HDL 45, LDL 102  04/24/2021: Chol 220, TG 124, HDL 49, LDL 149  07/27/2020, 11/03/2020 Glucose 83, BUN/Cr 16/1.25. EGFR 45. Na/K 142/5.2 . Rest of the CMP normal H/H 12.9/39.7. MCV 95.4. Platelets 182 HbA1C N/A Chol 241, TG 123, HDL 51, LDL 165 TSH 1.79  normal   Review of Systems  Cardiovascular:  Positive for claudication (improving) and leg swelling (mild bilateral, left > right). Negative for chest pain, dyspnea on exertion, palpitations and syncope.  Musculoskeletal:        B/l feet pain - improving       Vitals:   02/19/22 0908 02/19/22 0921  BP: (!) 149/76 134/85  Pulse: 97 92  Resp: 17   Temp: 97.9 F (36.6 C)   SpO2: 100% 100%    Body mass index is 36.64 kg/m. Filed Weights   02/19/22 0908  Weight: 220 lb 3.2 oz (99.9 kg)     Objective:   Physical Exam Vitals reviewed.  Constitutional:      General: She is not in acute distress. Neck:     Vascular: No JVD.  Cardiovascular:  Rate and Rhythm: Normal rate and regular rhythm.     Pulses:          Femoral pulses are 2+ on the right side and 2+ on the left side.      Popliteal pulses are 1+ on the right side and 2+ on the left side.       Dorsalis pedis pulses are 0 on the right side and 0 on the left side.       Posterior tibial pulses are 0 on the right side and 2+ on the left side.     Heart sounds: Normal heart sounds. No murmur heard.    Comments: Mild tenderness in left thigh without obvious hematoma Pulmonary:     Effort: Pulmonary effort is normal.     Breath sounds: Normal breath sounds. No wheezing or rales.  Musculoskeletal:     Right lower leg: Edema (trace) present.     Left lower leg: Edema (trace) present.  Feet:     Right foot:     Skin integrity: Dry skin present. No ulcer.     Toenail Condition: Right toenails are abnormally thick.     Left foot:     Skin integrity: Dry skin present. No ulcer.     Toenail Condition: Left toenails are abnormally thick.   Assessment & Recommendations:   66 y.o. with hypertension, hyperlipidemia, hypothyroidism, multiple sclerosis, CAD, PAD s/p intervention to left SFA with atherectomy and overlapping drug-eluting stents. Patient quit smoking in July 2022.  PAD (peripheral artery disease) (HCC) S/p  intervention to left SFA with atherectomy and overlapping drug-eluting stents Continue aspirin and Plavix for at least 6 months Patient's claudication symptoms are improving. She does continue to have left thigh pain and tenderness without obvious hematoma.  Encourage patient that this will likely improve over the next 1 to 2 weeks and is most probably secondary to deep hematoma due to procedural blood loss.  Hemoglobin has improved from 7.5-9.3, recommend continued monitoring with PCP. We will obtain repeat ABI to establish postprocedure baseline. Encourage graduated exercise  Coronary artery disease involving native coronary artery of native heart without angina pectoris Seen on CT chest 07/2020 During hospitalization patient demonstrated hypoxia with exertion, without subjective feelings of shortness of breath.  This is likely due to underlying lung pathology secondary to history of tobacco use.  Recommend patient continue supplemental oxygen with exertion Continue aspirin Patient has history of statin intolerance, upon further questioning patient has previously discontinued these medications due to memory loss.  Patient was unable to tolerate atorvastatin, rosuvastatin, simvastatin, Nexlizet in the past. However discussed with patient and her sister that memory loss is likely not due to statin therapy, patient is now more receptive to rechallenging statin therapy.  Mixed hyperlipidemia Uncontrolled Unable to tolerate simvastatin, atorvastatin, rosuvastatin, nexlizet due to concerns of memory loss Discussed with patient options of PCSK9 inhibitor, Leqvio, or rechallenging statin therapy. Patient wishes to rechallenge statin therapy at this time as she is resistant to injectable medications.  We will restart Lipitor 40 mg p.o. daily.  We will consider repeat lipid profile testing at next office visit  Hypertension: Blood pressure is well controlled Continue losartan and amlodipine  Carotid  artery disease: Asymptomatic, moderate, bilateral  Memory loss: This appears to be a longstanding problem which is progressive.  Suspect this could be related to underlying cerebrovascular disease.  Low suspicion this is related to statin therapy. Recommend neurology evaluation, referral has been sent accordingly.  Follow-up in 4 weeks,  sooner if needed, with Dr. Virgina Jock  Patient was seen in collaboration with Dr. Virgina Jock. He also reviewed patient's chart and examined the patient. Dr. Virgina Jock is in agreement of the plan.    Alethia Berthold, PA-C 02/19/2022, 10:55 AM Office: [phone]

## 2022-02-19 ENCOUNTER — Encounter: Payer: Self-pay | Admitting: Student

## 2022-02-19 ENCOUNTER — Ambulatory Visit: Payer: Medicare Other | Admitting: Student

## 2022-02-19 ENCOUNTER — Other Ambulatory Visit: Payer: Self-pay

## 2022-02-19 VITALS — BP 134/85 | HR 92 | Temp 97.9°F | Resp 17 | Ht 65.0 in | Wt 220.2 lb

## 2022-02-19 DIAGNOSIS — R413 Other amnesia: Secondary | ICD-10-CM

## 2022-02-19 DIAGNOSIS — I2510 Atherosclerotic heart disease of native coronary artery without angina pectoris: Secondary | ICD-10-CM

## 2022-02-19 DIAGNOSIS — I739 Peripheral vascular disease, unspecified: Secondary | ICD-10-CM

## 2022-02-19 DIAGNOSIS — E782 Mixed hyperlipidemia: Secondary | ICD-10-CM

## 2022-02-19 LAB — CBC
Hematocrit: 27.7 % — ABNORMAL LOW (ref 34.0–46.6)
Hemoglobin: 9.3 g/dL — ABNORMAL LOW (ref 11.1–15.9)
MCH: 30 pg (ref 26.6–33.0)
MCHC: 33.6 g/dL (ref 31.5–35.7)
MCV: 89 fL (ref 79–97)
Platelets: 308 10*3/uL (ref 150–450)
RBC: 3.1 x10E6/uL — ABNORMAL LOW (ref 3.77–5.28)
RDW: 13.7 % (ref 11.7–15.4)
WBC: 8.2 10*3/uL (ref 3.4–10.8)

## 2022-02-19 MED ORDER — ATORVASTATIN CALCIUM 40 MG PO TABS: 40.0000 mg | ORAL_TABLET | Freq: Every day | ORAL | 3 refills | Status: DC

## 2022-02-20 ENCOUNTER — Other Ambulatory Visit: Payer: Self-pay | Admitting: Cardiology

## 2022-02-20 DIAGNOSIS — I10 Essential (primary) hypertension: Secondary | ICD-10-CM

## 2022-03-06 ENCOUNTER — Other Ambulatory Visit: Payer: Self-pay

## 2022-03-06 ENCOUNTER — Ambulatory Visit: Payer: Medicare Other

## 2022-03-06 DIAGNOSIS — I739 Peripheral vascular disease, unspecified: Secondary | ICD-10-CM

## 2022-03-14 ENCOUNTER — Encounter: Payer: Self-pay | Admitting: Cardiology

## 2022-03-14 ENCOUNTER — Other Ambulatory Visit: Payer: Self-pay

## 2022-03-14 ENCOUNTER — Ambulatory Visit: Payer: Medicare Other | Admitting: Cardiology

## 2022-03-14 VITALS — BP 155/68 | HR 63 | Temp 98.2°F | Resp 17 | Ht 65.0 in | Wt 217.2 lb

## 2022-03-14 DIAGNOSIS — E782 Mixed hyperlipidemia: Secondary | ICD-10-CM

## 2022-03-14 DIAGNOSIS — I739 Peripheral vascular disease, unspecified: Secondary | ICD-10-CM

## 2022-03-14 DIAGNOSIS — I2510 Atherosclerotic heart disease of native coronary artery without angina pectoris: Secondary | ICD-10-CM

## 2022-03-14 DIAGNOSIS — I10 Essential (primary) hypertension: Secondary | ICD-10-CM

## 2022-03-14 NOTE — Progress Notes (Signed)
? ? ?Patient referred by Dr. Jilda Panda for coronary artery disease, carotid bruit ? ?Subjective:  ? ?Monica Park, female    DOB: 1956/06/12, 66 y.o.   MRN: [id number] ? ? ? ?Chief Complaint  ?Patient presents with  ? PAD  ?  4 WEEKS  ? ? ? ?HPI ? ?66 y.o. Caucasian female with hypertension, hyperlipidemia, hypothyroidism, multiple sclerosis, CAD, PAD s/p intervention to left SFA with atherectomy and overlapping drug-eluting stents (01/2022) ? ?Patient is here today with his sister.  He is doing well.  Left leg swelling is improving.  Patient is able to walk up to 5000 steps without any significant pain in her left leg.  She does report claudication in her right leg, but is not lifestyle limiting.  She is tolerating lipid-lowering atorvastatin well.  Blood pressure generally well controlled, mildly elevated today.   ? ? ? ?Current Outpatient Medications:  ?  acetaminophen (TYLENOL) 650 MG CR tablet, Take 1,300 mg by mouth daily., Disp: , Rfl:  ?  albuterol (VENTOLIN HFA) 108 (90 Base) MCG/ACT inhaler, Inhale 2 puffs into the lungs every 4 (four) hours as needed for wheezing or shortness of breath., Disp: , Rfl:  ?  amLODipine (NORVASC) 5 MG tablet, TAKE 1 TABLET(5 MG) BY MOUTH DAILY, Disp: 30 tablet, Rfl: 3 ?  aspirin 81 MG EC tablet, Take 1 tablet (81 mg total) by mouth daily., Disp: 30 tablet, Rfl: 2 ?  atorvastatin (LIPITOR) 40 MG tablet, Take 1 tablet (40 mg total) by mouth daily., Disp: 90 tablet, Rfl: 3 ?  BIOTIN PO, Take 10,000 mcg by mouth daily., Disp: , Rfl:  ?  cetirizine (ZYRTEC) 10 MG tablet, Take 10 mg by mouth daily., Disp: , Rfl:  ?  Cholecalciferol (VITAMIN D3 PO), Take 50,000 Units by mouth every Friday., Disp: , Rfl:  ?  clopidogrel (PLAVIX) 75 MG tablet, TAKE 1 TABLET(75 MG) BY MOUTH DAILY, Disp: 90 tablet, Rfl: 3 ?  DULoxetine (CYMBALTA) 60 MG capsule, Take 1 capsule (60 mg total) by mouth daily., Disp: 30 capsule, Rfl: 5 ?  gabapentin (NEURONTIN) 600 MG tablet, Take 1 tablet (600 mg total)  by mouth 3 (three) times daily., Disp: 90 tablet, Rfl: 11 ?  levothyroxine (SYNTHROID) 25 MCG tablet, Take 25 mcg by mouth daily., Disp: , Rfl:  ?  losartan (COZAAR) 50 MG tablet, Take 50 mg by mouth daily., Disp: , Rfl:  ?  montelukast (SINGULAIR) 10 MG tablet, Take 10 mg by mouth daily., Disp: , Rfl:  ?  pantoprazole (PROTONIX) 40 MG tablet, Take 40 mg by mouth daily., Disp: , Rfl:  ?  Probiotic Product (HEALTHY COLON PO), Take 1 capsule by mouth daily., Disp: , Rfl:  ?  Red Yeast Rice 600 MG CAPS, Take 1 capsule by mouth daily., Disp: , Rfl:  ? ? ? ?Cardiovascular and other pertinent studies: ? ?ABI 03/06/2022:  ?This exam reveals mildly decreased perfusion of the right lower extremity,  ?noted at the dorsalis pedis artery level (ABI 0.81) with severely abnormal  ?dampened monophasic waveform pattern at the right ankle.    ?This exam reveals normal perfusion of the left lower extremity (ABI 1.01)  ?with mildly abnormal biphasic waveform pattern at the left ankle.  ?Compared to the study done on 02/07/2021, no change in the right ABI, left  ?ABI has improved from 0.62. ? ?EKG 02/19/2022:  ?Sinus rhythm at a rate of 84 bpm.  Normal axis.  Nonspecific thromboembolic T wave abnormality.  Compared EKG 01/24/2022,  no significant change. ? ?Abdominal aortogram with lower extremity intervention 02/12/2022: ?Patent b/l aortoiliac arteries with mild disease ?Flush occluded Lt SFA CTO, reconstituting in distal SFA through collaterals from profunda ?Two vessel below the knee run-off with occluded ATA ?  ?Complex peripheral intervention: ?Directional atherectomy Lt SFA ?    PTA and overlapping stenting Lt SFA ?    Eluvia drug eluting stents 1.6X096 cm, 6.0X150 cm, 6.0X60 mm ?    Post dilatation with 6.0 mm balloons up to 24 atm ? ?Renal artery duplex 01/08/2022: ?1. Technically challenging examination secondary to patient body habitus. CT or MR arteriogram may be more sensitive for the detection of renal artery stenosis. ?2. No  convincing evidence of hemodynamically significant renal artery stenosis by duplex ultrasound. ?3. Renal size discrepancy. The left kidney is smaller than the right. ? ?Carotid artery duplex 10/04/2021:  ?Duplex suggests stenosis in the right internal carotid artery (16-49%).  ?Duplex suggests stenosis in the right external carotid artery (<50%).  ?Heterogeneous plaque noted.  ?Duplex suggests stenosis in the left internal carotid artery (50-69%).  ?Duplex suggests stenosis in the left external carotid artery (<50%).  ?Homogeneous plaque noted.  ?Antegrade right vertebral artery flow. Antegrade left vertebral artery flow.  ?Compared to the study done on 03/27/2021, right ICA stenosis severity has reduced from 50 to 69% to the present less than 50%. Follow up in six months is appropriate if clinically indicated. ? ?EKG 05/31/2021: ?Sinus rhythm 64 bpm ?Normal EKG ? ?ABI 02/07/2021:  ?This exam reveals mildly decreased perfusion of the right lower extremity,  ?noted at the post tibial artery level (ABI 0.92) and moderately decreased  ?perfusion of the left lower extremity, noted at the anterior tibial and  ?post tibial artery level (ABI 0.62).  ?Consider complete lower extremity arterial duplex if clinically indicated.  ? ?Echocardiogram 02/07/2021:  ?1. Normal LV systolic function with visual EF 55-60%. Left ventricle  ?cavity is normal in size. Mild left ventricular hypertrophy. Normal global  ?wall motion. Indeterminate diastolic filling pattern, normal LAP.  ?2. Left atrial cavity is mildly dilated.  ?3. Mild (Grade I) mitral regurgitation.  ?4. IVC is dilated with respiratory variation.  ?5. No prior study for comparison. ? ?EKG 01/04/2021: ?Sinus rhythm 56 bpm ?Cannot exclude old anteroseptal infarct ? ?CT Chest 08/15/2020: ?1. Lung-RADS 2, benign appearance or behavior. Continue annual ?screening with low-dose chest CT without contrast in 12 months. ?2. Three-vessel coronary atherosclerosis. ?3. Aortic Atherosclerosis  (ICD10-I70.0) and Emphysema (ICD10-J43.9). ?  ?Recent labs: ?02/18/2022:  ?Hgb 9.3, HCT 27.7, MCV 89, platelet 308 ? ?02/14/2022: ?Sodium 135, potassium 4.0, BUN 14, creatinine 1.43, GFR 40 ?Hgb 7.5, HCT 23.3, MCV 93.6, platelets 175 ? ?12/26/2021: ?BUN 20, creatinine 1.46, GFR 39, sodium 138, potassium 4.3 ?Hgb 11.4, HCT 35.4, MCV 92, platelet 250 ? ?12/03/2021: ?BUN 20, creatinine 1.67, GFR 34, sodium 143, potassium 5.2 ? ?09/04/2021: ?Chol 170, TG 129, HDL 45, LDL 102 ? ?04/24/2021: ?Chol 220, TG 124, HDL 49, LDL 149 ? ?07/27/2020, 11/03/2020 ?Glucose 83, BUN/Cr 16/1.25. EGFR 45. Na/K 142/5.2 . Rest of the CMP normal ?H/H 12.9/39.7. MCV 95.4. Platelets 182 ?HbA1C N/A ?Chol 241, TG 123, HDL 51, LDL 165 ?TSH 1.79 normal ? ? ?Review of Systems  ?Cardiovascular:  Positive for claudication (improving) and leg swelling (mild bilateral, left > right). Negative for chest pain, dyspnea on exertion, palpitations and syncope.  ?Musculoskeletal:   ?     B/l feet pain - improving  ? ?   ? ?Vitals:  ?  03/14/22 0932  ?BP: (!) 155/68  ?Pulse: 63  ?Resp: 17  ?Temp: 98.2 ?F (36.8 ?C)  ?SpO2: 99%  ? ? ?Body mass index is 36.14 kg/m?. Danley Danker Weights  ? 03/14/22 6712  ?Weight: 217 lb 3.2 oz (98.5 kg)  ? ? ? ?Objective:  ? Physical Exam ?Vitals reviewed.  ?Constitutional:   ?   General: She is not in acute distress. ?Neck:  ?   Vascular: No JVD.  ?Cardiovascular:  ?   Rate and Rhythm: Normal rate and regular rhythm.  ?   Pulses:     ?     Femoral pulses are 2+ on the right side and 2+ on the left side. ?     Popliteal pulses are 1+ on the right side and 2+ on the left side.  ?     Dorsalis pedis pulses are 1+ on the right side and 1+ on the left side.  ?     Posterior tibial pulses are 0 on the right side and 2+ on the left side.  ?   Heart sounds: Normal heart sounds. No murmur heard. ?Pulmonary:  ?   Effort: Pulmonary effort is normal.  ?   Breath sounds: Normal breath sounds. No wheezing or rales.  ?Musculoskeletal:  ?   Right lower leg:  No edema.  ?   Left lower leg: Edema (1+ at ankle level) present.  ?Feet:  ?   Right foot:  ?   Skin integrity: Dry skin present. No ulcer.  ?   Toenail Condition: Right toenails are abnormally thick.  ?

## 2022-03-15 ENCOUNTER — Other Ambulatory Visit: Payer: Self-pay | Admitting: Cardiology
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
## 2022-04-11 ENCOUNTER — Encounter: Payer: Self-pay | Admitting: Neurology

## 2022-04-11 ENCOUNTER — Ambulatory Visit (INDEPENDENT_AMBULATORY_CARE_PROVIDER_SITE_OTHER): Payer: Medicare Other | Admitting: Neurology

## 2022-04-11 VITALS — BP 130/55 | HR 55 | Ht 65.0 in | Wt 223.5 lb

## 2022-04-11 DIAGNOSIS — I739 Peripheral vascular disease, unspecified: Secondary | ICD-10-CM | POA: Diagnosis not present

## 2022-04-11 DIAGNOSIS — E538 Deficiency of other specified B group vitamins: Secondary | ICD-10-CM

## 2022-04-11 DIAGNOSIS — R9082 White matter disease, unspecified: Secondary | ICD-10-CM

## 2022-04-11 DIAGNOSIS — R208 Other disturbances of skin sensation: Secondary | ICD-10-CM | POA: Diagnosis not present

## 2022-04-11 DIAGNOSIS — M5416 Radiculopathy, lumbar region: Secondary | ICD-10-CM

## 2022-04-11 DIAGNOSIS — R413 Other amnesia: Secondary | ICD-10-CM | POA: Diagnosis not present

## 2022-04-11 DIAGNOSIS — R0683 Snoring: Secondary | ICD-10-CM

## 2022-04-11 DIAGNOSIS — G4719 Other hypersomnia: Secondary | ICD-10-CM

## 2022-04-11 MED ORDER — BUSPIRONE HCL 15 MG PO TABS: 15.0000 mg | ORAL_TABLET | Freq: Two times a day (BID) | ORAL | 5 refills | Status: DC

## 2022-04-18 ENCOUNTER — Telehealth: Payer: Self-pay

## 2022-04-18 NOTE — Telephone Encounter (Signed)
LVM for pt to call me back to schedule sleep study  

## 2022-04-24 LAB — VITAMIN B12: Vitamin B-12: 574 pg/mL (ref 232–1245)

## 2022-04-24 LAB — THYROID PANEL WITH TSH
Free Thyroxine Index: 1.5 (ref 1.2–4.9)
T3 Uptake Ratio: 28 % (ref 24–39)
T4, Total: 5.3 ug/dL (ref 4.5–12.0)
TSH: 1.8 u[IU]/mL (ref 0.450–4.500)

## 2022-04-29 ENCOUNTER — Encounter (HOSPITAL_COMMUNITY): Payer: Self-pay | Admitting: Emergency Medicine

## 2022-04-29 ENCOUNTER — Ambulatory Visit (HOSPITAL_COMMUNITY)
Admission: EM | Admit: 2022-04-29 | Discharge: 2022-04-29 | Disposition: A | Discharge status: Home / Self Care | Payer: Medicare Other | Attending: Internal Medicine | Admitting: Internal Medicine

## 2022-04-29 DIAGNOSIS — S30861A Insect bite (nonvenomous) of abdominal wall, initial encounter: Secondary | ICD-10-CM | POA: Diagnosis not present

## 2022-04-29 DIAGNOSIS — W57XXXA Bitten or stung by nonvenomous insect and other nonvenomous arthropods, initial encounter: Secondary | ICD-10-CM | POA: Diagnosis not present

## 2022-04-29 DIAGNOSIS — R110 Nausea: Secondary | ICD-10-CM | POA: Diagnosis not present

## 2022-04-29 MED ORDER — DOXYCYCLINE HYCLATE 100 MG PO CAPS: 200.0000 mg | ORAL_CAPSULE | Freq: Once | ORAL | 0 refills | Status: AC

## 2022-04-29 MED ORDER — ONDANSETRON 4 MG PO TBDP
4.0000 mg | ORAL_TABLET | Freq: Once | ORAL | Status: AC
  Administered 2022-04-29: 4 mg via ORAL

## 2022-04-29 MED ORDER — ONDANSETRON 4 MG PO TBDP: 4.0000 mg | ORAL_TABLET | Freq: Three times a day (TID) | ORAL | 0 refills | Status: AC | PRN

## 2022-04-29 MED ORDER — ACETAMINOPHEN 325 MG PO TABS
975.0000 mg | ORAL_TABLET | Freq: Once | ORAL | Status: AC
  Administered 2022-04-29: 975 mg via ORAL

## 2022-04-29 MED ORDER — ACETAMINOPHEN 325 MG PO TABS
ORAL_TABLET | ORAL | Status: AC
  Filled 2022-04-29: qty 3

## 2022-04-29 MED ORDER — ONDANSETRON 4 MG PO TBDP
ORAL_TABLET | ORAL | Status: AC
  Filled 2022-04-29: qty 1

## 2022-04-29 NOTE — Discharge Instructions (Addendum)
Please take medications as prescribed ?Increase oral fluid intake ?Severe nausea with a headache may be as a result of the salad you ate ?If it is food poisoning I expect it to subside in the next 12 to 24 hours ?If symptoms persist please return to urgent care to be reevaluated ?Symptoms is less likely associated with tickborne diseases. ?

## 2022-04-29 NOTE — ED Triage Notes (Signed)
Pt is present today with concerns of a tick bite on the right side of her abdomen. Pt states that she doesn't remember when she was bitten but does feel nauseated.  ?

## 2022-04-29 NOTE — ED Provider Notes (Signed)
?[facility] ? ? ? ?CSN: [id number] ?Arrival date & time: 04/29/22  1841 ? ? ?  ? ?History   ?Chief Complaint ?Chief Complaint  ?Patient presents with  ? Tick Removal  ? ? ?HPI ?Monica Park is a 66 y.o. female comes to the urgent care with concerns of a tick bite to the right side of the abdomen.  Patient estimates that the tick may have been embedded for about 24 to 48 hours.  She took the tick out this morning.  Patient is also complaining of a headache and nausea with no vomiting.  She ate some salad earlier today and a few hours after that she started experiencing some abdominal discomfort with intense nausea and a headache.  No diarrhea.  No vomiting.  Patient denies any fever.  She has a headache but denies any generalized body aches.  No dizziness, near syncope or syncopal episodes. ? ?Patient has a history of hypertension controlled on antihypertensive medication.  Patient has taken her medications this morning.  She denies any chest pain or chest pressure.  Her blood pressure is elevated in the urgent care likely secondary to intense nausea. ? ?HPI ? ?Past Medical History:  ?Diagnosis Date  ? Anxiety   ? Hypertension   ? Migraine   ? ? ?Patient Active Problem List  ? Diagnosis Date Noted  ? Memory difficulties 04/11/2022  ? Asymptomatic bilateral carotid artery stenosis 05/16/2021  ? Spinal stenosis of lumbosacral region 05/03/2021  ? Lumbar radiculopathy 05/03/2021  ? Bilateral carotid bruits 04/04/2021  ? Cigarette nicotine dependence without complication 04/04/2021  ? Mixed hyperlipidemia 01/04/2021  ? PAD (peripheral artery disease) (HCC) 01/04/2021  ? Coronary artery disease involving native coronary artery of native heart without angina pectoris 01/04/2021  ? Dysesthesia 12/11/2020  ? White matter abnormality on MRI of brain 12/11/2020  ? Tobacco abuse 12/11/2020  ? Essential hypertension 12/11/2020  ? Trochanteric bursitis, left hip 08/23/2020  ? ? ?Past Surgical History:  ?Procedure  Laterality Date  ? ABDOMINAL AORTOGRAM W/LOWER EXTREMITY N/A 02/12/2022  ? Procedure: ABDOMINAL AORTOGRAM W/LOWER EXTREMITY;  Surgeon: Elder Negus, MD;  [facility];  Service: Cardiovascular;  Laterality: N/A;  ? CESAREAN SECTION    ? 1976, 1979  ? OTHER SURGICAL HISTORY  1991  ? nerve block, right leg  ? PERIPHERAL VASCULAR ATHERECTOMY  02/12/2022  ? Procedure: PERIPHERAL VASCULAR ATHERECTOMY;  Surgeon: Elder Negus, MD;  [facility]: [facility];  Service: Cardiovascular;;  LT SFA  ? PERIPHERAL VASCULAR INTERVENTION  02/12/2022  ? Procedure: PERIPHERAL VASCULAR INTERVENTION;  Surgeon: Elder Negus, MD;  [facility]: [facility];  Service: Cardiovascular;;  LT SFA  ? WRIST SURGERY Left 2009  ? left arm/wrist  ? ? ?OB History   ?No obstetric history on file. ?  ? ? ? ?Home Medications   ? ?Prior to Admission medications   ?Medication Sig Start Date End Date Taking? Authorizing Provider  ?doxycycline (VIBRAMYCIN) 100 MG capsule Take 2 capsules (200 mg total) by mouth once for 1 dose. 04/29/22 04/29/22 Yes Boyd Litaker, Britta Mccreedy, MD  ?ondansetron (ZOFRAN-ODT) 4 MG disintegrating tablet Take 1 tablet (4 mg total) by mouth every 8 (eight) hours as needed for nausea or vomiting. 04/29/22  Yes Sriyan Cutting, Britta Mccreedy, MD  ?acetaminophen (TYLENOL) 650 MG CR tablet Take 1,300 mg by mouth daily.    [provider]  ?albuterol (VENTOLIN HFA) 108 (90 Base) MCG/ACT inhaler Inhale 2 puffs into the lungs  every 4 (four) hours as needed for wheezing or shortness of breath. 06/22/20   [provider]  ?amLODipine (NORVASC) 5 MG tablet TAKE 1 TABLET(5 MG) BY MOUTH DAILY 02/20/22   Cantwell, Celeste C, PA-C  ?aspirin 81 MG EC tablet Take 1 tablet (81 mg total) by mouth daily. 02/12/22 02/12/23  Patwardhan, Anabel Bene, MD  ?atorvastatin (LIPITOR) 40 MG tablet Take 1 tablet (40 mg total) by mouth daily. 02/19/22 02/19/23  Cantwell, Celeste C, PA-C  ?BIOTIN PO Take 10,000 mcg by mouth daily.     [provider]  ?busPIRone (BUSPAR) 15 MG tablet Take 1 tablet (15 mg total) by mouth 2 (two) times daily. 04/11/22   Sater, Pearletha Furl, MD  ?cetirizine (ZYRTEC) 10 MG tablet Take 10 mg by mouth daily.    [provider]  ?Cholecalciferol (VITAMIN D3 PO) Take 50,000 Units by mouth every Friday.    [provider]  ?clopidogrel (PLAVIX) 75 MG tablet TAKE 1 TABLET(75 MG) BY MOUTH DAILY 12/18/21   Patwardhan, Manish J, MD  ?DULoxetine (CYMBALTA) 60 MG capsule Take 1 capsule (60 mg total) by mouth daily. 12/27/21   Sater, Pearletha Furl, MD  ?gabapentin (NEURONTIN) 600 MG tablet Take 1 tablet (600 mg total) by mouth 3 (three) times daily. 12/27/21   Sater, Pearletha Furl, MD  ?levothyroxine (SYNTHROID) 25 MCG tablet Take 25 mcg by mouth daily. 06/03/20   [provider]  ?losartan (COZAAR) 50 MG tablet Take 50 mg by mouth daily.    [provider]  ?montelukast (SINGULAIR) 10 MG tablet Take 10 mg by mouth daily. 04/22/20   [provider]  ?pantoprazole (PROTONIX) 40 MG tablet Take 40 mg by mouth daily. 06/10/20   [provider]  ?Probiotic Product (HEALTHY COLON PO) Take 1 capsule by mouth daily.    [provider]  ?Red Yeast Rice 600 MG CAPS Take 1 capsule by mouth daily.    [provider]  ? ? ?Family History ?Family History  ?Problem Relation Age of Onset  ? Parkinson's disease Mother   ? Cervical cancer Mother   ? Dementia Father   ? Lung disease Father   ? Rheum arthritis Sister   ? Thyroid cancer Sister   ? Sarcoidosis Sister   ? Heart Problems Brother   ? ? ?Social History ?Social History  ? ?Tobacco Use  ? Smoking status: Former  ?  Packs/day: 0.50  ?  Years: 30.00  ?  Pack years: 15.00  ?  Types: Cigarettes  ?  Quit date: 06/25/2021  ?  Years since quitting: 0.8  ? Smokeless tobacco: Never  ?Vaping Use  ? Vaping Use: Never used  ?Substance Use Topics  ? Alcohol use: Not Currently  ? Drug use: Never  ? ? ? ?Allergies   ?Statins, Aspirin, Codeine,  Levaquin [levofloxacin], Toradol [ketorolac tromethamine], and Penicillins ? ? ?Review of Systems ?Review of Systems  ?Constitutional: Negative.  Negative for chills, fatigue and fever.  ?HENT: Negative.  Negative for sinus pain and sore throat.   ?Gastrointestinal: Negative.   ?Genitourinary: Negative.   ?Musculoskeletal:  Negative for arthralgias.  ?Skin:  Positive for color change and rash.  ?Neurological:  Positive for headaches.  ? ? ?Physical Exam ?Triage Vital Signs ?ED Triage Vitals [04/29/22 1923]  ?Enc Vitals Group  ?   BP (!) 175/62  ?   Pulse Rate (!) 57  ?   Resp 18  ?   Temp (!) 97.3 ?F (36.3 ?C)  ?  Temp Source Oral  ?   SpO2 99 %  ?   Weight   ?   Height   ?   Head Circumference   ?   Peak Flow   ?   Pain Score 7  ?   Pain Loc   ?   Pain Edu?   ?   Excl. in GC?   ? ?No data found. ? ?Updated Vital Signs ?BP (!) 175/62   Pulse (!) 57   Temp (!) 97.3 ?F (36.3 ?C) (Oral)   Resp 18   SpO2 99%  ? ?Visual Acuity ?Right Eye Distance:   ?Left Eye Distance:   ?Bilateral Distance:   ? ?Right Eye Near:   ?Left Eye Near:    ?Bilateral Near:    ? ?Physical Exam ?Vitals and nursing note reviewed.  ?Constitutional:   ?   General: She is in acute distress.  ?   Appearance: Normal appearance. She is not toxic-appearing or diaphoretic.  ?HENT:  ?   Right Ear: Tympanic membrane normal.  ?   Left Ear: Tympanic membrane normal.  ?Cardiovascular:  ?   Rate and Rhythm: Normal rate and regular rhythm.  ?Pulmonary:  ?   Effort: Pulmonary effort is normal.  ?   Breath sounds: Normal breath sounds.  ?Abdominal:  ?   General: Bowel sounds are normal.  ?   Palpations: Abdomen is soft.  ?Musculoskeletal:     ?   General: Normal range of motion.  ?Skin: ?   General: Skin is warm.  ?   Comments: Dime size erythema at the site of the bite.  ?Neurological:  ?   Mental Status: She is alert.  ? ? ? ?UC Treatments / Results  ?Labs ?(all labs ordered are listed, but only abnormal results are displayed) ?Labs Reviewed - No data to  display ? ?EKG ? ? ?Radiology ?No results found. ? ?Procedures ?Procedures (including critical care time) ? ?Medications Ordered in UC ?Medications  ?ondansetron (ZOFRAN-ODT) disintegrating tablet 4 mg (has no adminis

## 2022-05-01 ENCOUNTER — Ambulatory Visit (INDEPENDENT_AMBULATORY_CARE_PROVIDER_SITE_OTHER): Payer: Medicare Other | Admitting: Neurology

## 2022-05-01 DIAGNOSIS — G4719 Other hypersomnia: Secondary | ICD-10-CM

## 2022-05-01 DIAGNOSIS — R0683 Snoring: Secondary | ICD-10-CM

## 2022-05-01 DIAGNOSIS — G4710 Hypersomnia, unspecified: Secondary | ICD-10-CM

## 2022-05-06 ENCOUNTER — Encounter: Payer: Self-pay | Admitting: *Deleted

## 2022-05-06 NOTE — Progress Notes (Signed)
? ?  [facility] ? ?HOME SLEEP STUDY ? ?STUDY DATE: 05/01/2022 ?PATIENT NAME: Monica Park ?DOB: January 20, 1956 ?MRN: [id number] ? ?ORDERING CLINICIAN: Richard A. Epimenio Foot, MD, PhD ?REFERRING CLINICIAN: Richard A. Epimenio Foot, MD. PhD ? ? ?CLINICAL INFORMATION: 66 year old woman with reduced memory, snoring and excessive daytime sleepiness ? ? ?IMPRESSION:  ?Mild obstructive sleep apnea (pAHI 3% = 12.4/hr; ODI4% = 6.2/hr) ?Normal sleep efficiency. ? ? ?RECOMMENDATION: ?Because there is also mild to moderate excessive daytime sleepiness, CPAP therapy could be considered for the mild obstructive sleep apnea.  If interested, consider ordering AutoPap with a pressure of 5-15 cm H2O. ?Alternatively, weight loss or an oral appliance might also be beneficial. ?Follow-up with Dr. Epimenio Foot. ? ? ?INTERPRETING PHYSICIAN:  ? ?Richard A. Epimenio Foot, MD, PhD, [place] ?Certified in Neurology, Clinical Neurophysiology, Sleep Medicine, Pain Medicine and Neuroimaging ? ?[facility] ?[address], [address] ?[place], [place] [address] ?[phone] ? ? ? ? ? ? ? ? ? ? ? ? ? ? ? ? ? ? ? ? ? ? ? ? ? ?

## 2022-05-07 NOTE — Telephone Encounter (Signed)
Called and spoke with pt. Relayed results per Dr. Bonnita Hollow note. Pt does not wish to try CPAP. She recently started weight loss program and wishes to pursue this route first.  ?

## 2022-05-26 ENCOUNTER — Other Ambulatory Visit: Payer: Self-pay | Admitting: Student

## 2022-05-26 DIAGNOSIS — I10 Essential (primary) hypertension: Secondary | ICD-10-CM

## 2022-06-23 ENCOUNTER — Other Ambulatory Visit: Payer: Self-pay | Admitting: Neurology

## 2022-06-24 NOTE — Telephone Encounter (Signed)
Rx refilled.

## 2022-07-12 ENCOUNTER — Ambulatory Visit: Payer: Medicare Other | Admitting: Cardiology

## 2022-07-12 ENCOUNTER — Encounter: Payer: Self-pay | Admitting: Cardiology

## 2022-07-12 VITALS — BP 175/78 | HR 61 | Temp 97.0°F | Resp 16 | Ht 65.0 in | Wt 223.0 lb

## 2022-07-12 DIAGNOSIS — I2510 Atherosclerotic heart disease of native coronary artery without angina pectoris: Secondary | ICD-10-CM

## 2022-07-12 DIAGNOSIS — I10 Essential (primary) hypertension: Secondary | ICD-10-CM

## 2022-07-12 DIAGNOSIS — E782 Mixed hyperlipidemia: Secondary | ICD-10-CM

## 2022-07-12 DIAGNOSIS — I739 Peripheral vascular disease, unspecified: Secondary | ICD-10-CM

## 2022-07-12 MED ORDER — FUROSEMIDE 40 MG PO TABS
40.0000 mg | ORAL_TABLET | Freq: Every day | ORAL | 3 refills | Status: DC
Start: 2022-07-12 — End: 2022-08-15

## 2022-07-12 NOTE — Progress Notes (Signed)
Patient referred by Dr. Jilda Panda for coronary artery disease, carotid bruit  Subjective:   Monica Park, female    DOB: Aug 31, 1956, 66 y.o.   MRN: [id number]    Chief Complaint  Patient presents with   PAD   Follow-up     HPI  66 y.o. Caucasian female with hypertension, hyperlipidemia, hypothyroidism, multiple sclerosis, CAD, PAD s/p intervention to left SFA with atherectomy and overlapping drug-eluting stents (01/2022)  Patient denies any chest pain, shortness of breath.  Claudication is improved, particularly lately.  However, both legs have swelling, left greater than right.  Pressure is elevated today.  She attributes this to having a lot of medications.  Complaints.  She has been feeling well.    Current Outpatient Medications:    acetaminophen (TYLENOL) 650 MG CR tablet, Take 1,300 mg by mouth daily., Disp: , Rfl:    albuterol (VENTOLIN HFA) 108 (90 Base) MCG/ACT inhaler, Inhale 2 puffs into the lungs every 4 (four) hours as needed for wheezing or shortness of breath., Disp: , Rfl:    amLODipine (NORVASC) 5 MG tablet, TAKE 1 TABLET(5 MG) BY MOUTH DAILY, Disp: 90 tablet, Rfl: 0   aspirin 81 MG EC tablet, Take 1 tablet (81 mg total) by mouth daily., Disp: 30 tablet, Rfl: 2   atorvastatin (LIPITOR) 40 MG tablet, Take 1 tablet (40 mg total) by mouth daily., Disp: 90 tablet, Rfl: 3   BIOTIN PO, Take 10,000 mcg by mouth daily., Disp: , Rfl:    busPIRone (BUSPAR) 15 MG tablet, Take 1 tablet (15 mg total) by mouth 2 (two) times daily., Disp: 60 tablet, Rfl: 5   cetirizine (ZYRTEC) 10 MG tablet, Take 10 mg by mouth daily., Disp: , Rfl:    Cholecalciferol (VITAMIN D3 PO), Take 50,000 Units by mouth every Friday., Disp: , Rfl:    clopidogrel (PLAVIX) 75 MG tablet, TAKE 1 TABLET(75 MG) BY MOUTH DAILY, Disp: 90 tablet, Rfl: 3   DULoxetine (CYMBALTA) 60 MG capsule, TAKE 1 CAPSULE(60 MG) BY MOUTH DAILY, Disp: 30 capsule, Rfl: 5   gabapentin (NEURONTIN) 600 MG tablet, Take 1 tablet  (600 mg total) by mouth 3 (three) times daily., Disp: 90 tablet, Rfl: 11   levothyroxine (SYNTHROID) 25 MCG tablet, Take 25 mcg by mouth daily., Disp: , Rfl:    losartan (COZAAR) 50 MG tablet, Take 50 mg by mouth daily., Disp: , Rfl:    montelukast (SINGULAIR) 10 MG tablet, Take 10 mg by mouth daily., Disp: , Rfl:    ondansetron (ZOFRAN-ODT) 4 MG disintegrating tablet, Take 1 tablet (4 mg total) by mouth every 8 (eight) hours as needed for nausea or vomiting., Disp: 20 tablet, Rfl: 0   pantoprazole (PROTONIX) 40 MG tablet, Take 40 mg by mouth daily., Disp: , Rfl:    Probiotic Product (HEALTHY COLON PO), Take 1 capsule by mouth daily., Disp: , Rfl:    Red Yeast Rice 600 MG CAPS, Take 1 capsule by mouth daily., Disp: , Rfl:     Cardiovascular and other pertinent studies:  EKG 07/12/2022: Sinus rhythm 54 bpm Nonspecific T wave abnormality  ABI 03/06/2022:  This exam reveals mildly decreased perfusion of the right lower extremity,  noted at the dorsalis pedis artery level (ABI 0.81) with severely abnormal  dampened monophasic waveform pattern at the right ankle.    This exam reveals normal perfusion of the left lower extremity (ABI 1.01)  with mildly abnormal biphasic waveform pattern at the left ankle.  Compared to the study  done on 02/07/2021, no change in the right ABI, left  ABI has improved from 0.62.  EKG 02/19/2022:  Sinus rhythm at a rate of 84 bpm.  Normal axis.  Nonspecific thromboembolic T wave abnormality.  Compared EKG 01/24/2022, no significant change.  Abdominal aortogram with lower extremity intervention 02/12/2022: Patent b/l aortoiliac arteries with mild disease Flush occluded Lt SFA CTO, reconstituting in distal SFA through collaterals from profunda Two vessel below the knee run-off with occluded ATA   Complex peripheral intervention: Directional atherectomy Lt SFA     PTA and overlapping stenting Lt SFA     Eluvia drug eluting stents 6.0X150 cm, 6.0X150 cm, 6.0X60 mm      Post dilatation with 6.0 mm balloons up to 24 atm  Renal artery duplex 01/08/2022: 1. Technically challenging examination secondary to patient body habitus. CT or MR arteriogram may be more sensitive for the detection of renal artery stenosis. 2. No convincing evidence of hemodynamically significant renal artery stenosis by duplex ultrasound. 3. Renal size discrepancy. The left kidney is smaller than the right.  Carotid artery duplex 10/04/2021:  Duplex suggests stenosis in the right internal carotid artery (16-49%).  Duplex suggests stenosis in the right external carotid artery (<50%).  Heterogeneous plaque noted.  Duplex suggests stenosis in the left internal carotid artery (50-69%).  Duplex suggests stenosis in the left external carotid artery (<50%).  Homogeneous plaque noted.  Antegrade right vertebral artery flow. Antegrade left vertebral artery flow.  Compared to the study done on 03/27/2021, right ICA stenosis severity has reduced from 50 to 69% to the present less than 50%. Follow up in six months is appropriate if clinically indicated.  ABI 02/07/2021:  This exam reveals mildly decreased perfusion of the right lower extremity,  noted at the post tibial artery level (ABI 0.92) and moderately decreased  perfusion of the left lower extremity, noted at the anterior tibial and  post tibial artery level (ABI 0.62).  Consider complete lower extremity arterial duplex if clinically indicated.   Echocardiogram 02/07/2021:  1. Normal LV systolic function with visual EF 55-60%. Left ventricle  cavity is normal in size. Mild left ventricular hypertrophy. Normal global  wall motion. Indeterminate diastolic filling pattern, normal LAP.  2. Left atrial cavity is mildly dilated.  3. Mild (Grade I) mitral regurgitation.  4. IVC is dilated with respiratory variation.  5. No prior study for comparison.  CT Chest 08/15/2020: 1. Lung-RADS 2, benign appearance or behavior. Continue annual screening  with low-dose chest CT without contrast in 12 months. 2. Three-vessel coronary atherosclerosis. 3. Aortic Atherosclerosis (ICD10-I70.0) and Emphysema (ICD10-J43.9).   Recent labs: 03/2022: Hb 10.3. BUN/Cr 26/1.6. eGFR 36.  Chol 141, TG 94, HDL 41, LDL 81  07/27/2020, 11/03/2020 Glucose 83, BUN/Cr 16/1.25. EGFR 45. Na/K 142/5.2 . Rest of the CMP normal H/H 12.9/39.7. MCV 95.4. Platelets 182 HbA1C N/A Chol 241, TG 123, HDL 51, LDL 165 TSH 1.79 normal   Review of Systems  Cardiovascular:  Positive for claudication (improving) and leg swelling (mild bilateral, left > right). Negative for chest pain, dyspnea on exertion, palpitations and syncope.  Musculoskeletal:        B/l feet pain - improving        Vitals:   07/12/22 0931  BP: (!) 175/72  Pulse: 62  Resp: 16  Temp: (!) 97 F (36.1 C)  SpO2: 98%    Body mass index is 37.11 kg/m. Filed Weights   07/12/22 0931  Weight: 223 lb (101.2 kg)  Objective:   Physical Exam Vitals reviewed.  Constitutional:      General: She is not in acute distress. Neck:     Vascular: No JVD.  Cardiovascular:     Rate and Rhythm: Normal rate and regular rhythm.     Pulses:          Femoral pulses are 2+ on the right side and 2+ on the left side.      Popliteal pulses are 1+ on the right side and 2+ on the left side.       Dorsalis pedis pulses are 1+ on the right side and 2+ on the left side.       Posterior tibial pulses are 0 on the right side and 1+ on the left side.     Heart sounds: Normal heart sounds. No murmur heard. Pulmonary:     Effort: Pulmonary effort is normal.     Breath sounds: Normal breath sounds. No wheezing or rales.  Musculoskeletal:     Right lower leg: Edema (1+) present.     Left lower leg: Edema (2+) present.  Feet:     Right foot:     Skin integrity: Dry skin present. No ulcer.     Toenail Condition: Right toenails are abnormally thick.     Left foot:     Skin integrity: Dry skin present. No  ulcer.     Toenail Condition: Left toenails are abnormally thick.     No diagnosis found.    Assessment & Recommendations:   66 y.o. Caucasian female with hypertension, hyperlipidemia, hypothyroidism, multiple sclerosis, CAD, PAD s/p intervention to left SFA with atherectomy and overlapping drug-eluting stents (01/2022)  PAD (peripheral artery disease) ([place]) S/p intervention to left SFA with atherectomy and overlapping drug-eluting stents (01/2022) Continue aspirin and Plavix for at least 6 months Resolution of claudication symptoms in the left leg, with improvement in ABI from 0.6 to 1.0. Claudication symptoms in the right leg persist, but are not lifestyle imaging.  Leg edema: Bilateral leg edema, L>R.  He denies noticeable unilateral.  I reckon some of this may be due to hypertension possible diastolic dysfunction. Started Lasix 40 mg daily. Check BMP, BNP in 2 weeks.  Coronary artery disease involving native coronary artery of native heart without angina pectoris Seen on CT chest 07/2020 No angina symptoms at this time.  Continue medical management.  Mixed hyperlipidemia Tolerating Lipitor 40 mg daily.   LDL down to 81. Will discuss addition of Zetia or Repatha at next visit with goal LDL <70.  Hypertension: BP elevated today, possibly due to having run out of medications. Will reassess next visit.  Carotid artery disease: Asymptomatic, moderate, bilateral  F/u in 4 weeks    Nigel Mormon, MD Pager: [phone] Office: [phone]

## 2022-07-23 ENCOUNTER — Telehealth: Payer: Self-pay | Admitting: Neurology

## 2022-07-23 NOTE — Telephone Encounter (Signed)
Called the patient back. Pt states that she had a incident and she isn't sure if this related her neurologic condition or not. She has been evaluated with PCP who ordered some testing but given what happened she wanted to see about getting seen sooner here. Advised that since we don't technically see her for seizures or black out spells she should have a referral from pcp sent over advising what work up has been completed. Looks like she has had a CT completed 7/24
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
## 2022-08-05 ENCOUNTER — Ambulatory Visit: Payer: Medicare Other | Admitting: Neurology

## 2022-08-10 LAB — LIPID PANEL
Chol/HDL Ratio: 3.8 ratio (ref 0.0–4.4)
Cholesterol, Total: 215 mg/dL — ABNORMAL HIGH (ref 100–199)
HDL: 56 mg/dL (ref >39)
LDL Chol Calc (NIH): 143 mg/dL — ABNORMAL HIGH (ref 0–99)
Triglycerides: 89 mg/dL (ref 0–149)
VLDL Cholesterol Cal: 16 mg/dL (ref 5–40)

## 2022-08-10 LAB — BASIC METABOLIC PANEL
BUN/Creatinine Ratio: 16 (ref 12–28)
BUN: 23 mg/dL (ref 8–27)
CO2: 22 mmol/L (ref 20–29)
Calcium: 9.6 mg/dL (ref 8.7–10.3)
Chloride: 106 mmol/L (ref 96–106)
Creatinine, Ser: 1.48 mg/dL — ABNORMAL HIGH (ref 0.57–1.00)
Glucose: 84 mg/dL (ref 70–99)
Potassium: 4.8 mmol/L (ref 3.5–5.2)
Sodium: 144 mmol/L (ref 134–144)
eGFR: 39 mL/min/{1.73_m2} — ABNORMAL LOW (ref >59)

## 2022-08-10 LAB — BRAIN NATRIURETIC PEPTIDE: BNP: 170.5 pg/mL — ABNORMAL HIGH (ref 0.0–100.0)

## 2022-08-14 ENCOUNTER — Ambulatory Visit: Payer: Medicare Other | Admitting: Cardiology

## 2022-08-14 ENCOUNTER — Encounter: Payer: Self-pay | Admitting: Cardiology

## 2022-08-14 ENCOUNTER — Other Ambulatory Visit: Payer: Self-pay | Admitting: Cardiology

## 2022-08-14 VITALS — BP 144/61 | HR 65 | Temp 98.0°F | Resp 16 | Ht 65.0 in | Wt 225.0 lb

## 2022-08-14 DIAGNOSIS — I5032 Chronic diastolic (congestive) heart failure: Secondary | ICD-10-CM | POA: Insufficient documentation

## 2022-08-14 DIAGNOSIS — I739 Peripheral vascular disease, unspecified: Secondary | ICD-10-CM

## 2022-08-14 DIAGNOSIS — I10 Essential (primary) hypertension: Secondary | ICD-10-CM

## 2022-08-14 DIAGNOSIS — R55 Syncope and collapse: Secondary | ICD-10-CM | POA: Insufficient documentation

## 2022-08-14 DIAGNOSIS — E782 Mixed hyperlipidemia: Secondary | ICD-10-CM

## 2022-08-14 MED ORDER — EMPAGLIFLOZIN 10 MG PO TABS: 10.0000 mg | ORAL_TABLET | Freq: Every day | ORAL | 3 refills | Status: DC
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
## 2022-08-19 ENCOUNTER — Telehealth: Payer: Self-pay

## 2022-08-19 NOTE — Telephone Encounter (Signed)
Error

## 2022-08-20 ENCOUNTER — Inpatient Hospital Stay: Payer: Medicare Other

## 2022-08-20 DIAGNOSIS — R55 Syncope and collapse: Secondary | ICD-10-CM

## 2022-08-28 ENCOUNTER — Telehealth: Payer: Self-pay | Admitting: Pharmacy Technician

## 2022-08-28 ENCOUNTER — Other Ambulatory Visit: Payer: Self-pay

## 2022-08-28 NOTE — Telephone Encounter (Signed)
Auth Submission: APPROVED Payer: [employer] MEDICARE Medication & CPT/J Code(s) submitted: Leqvio (Inclisiran) [phone] Route of submission (phone, fax, portal): PORTAL Phone # Fax # Auth type: Buy/Bill Units/visits requested: X3 DOSES Reference number: [id number] Approval from: 08/28/22 to 08/29/23 at [facility] as Buy/Bill

## 2022-08-29 ENCOUNTER — Ambulatory Visit: Payer: Medicare Other | Admitting: Neurology

## 2022-09-04 ENCOUNTER — Other Ambulatory Visit: Payer: Self-pay

## 2022-09-04 ENCOUNTER — Ambulatory Visit: Payer: Medicare Other

## 2022-09-04 DIAGNOSIS — I5032 Chronic diastolic (congestive) heart failure: Secondary | ICD-10-CM

## 2022-09-04 MED ORDER — EMPAGLIFLOZIN 10 MG PO TABS: 10.0000 mg | ORAL_TABLET | Freq: Every day | ORAL | 3 refills | Status: DC

## 2022-09-05 ENCOUNTER — Ambulatory Visit (INDEPENDENT_AMBULATORY_CARE_PROVIDER_SITE_OTHER): Payer: Medicare Other

## 2022-09-05 VITALS — BP 95/62 | HR 50 | Temp 97.5°F | Resp 16 | Ht 65.0 in | Wt 223.0 lb

## 2022-09-05 DIAGNOSIS — E782 Mixed hyperlipidemia: Secondary | ICD-10-CM

## 2022-09-05 MED ORDER — INCLISIRAN SODIUM 284 MG/1.5ML ~~LOC~~ SOSY
284.0000 mg | PREFILLED_SYRINGE | Freq: Once | SUBCUTANEOUS | Status: AC
  Administered 2022-09-05: 284 mg via SUBCUTANEOUS
  Filled 2022-09-05: qty 1.5

## 2022-09-05 NOTE — Progress Notes (Signed)
Diagnosis: Hyperlipidemia  Provider:  Praveen Mannam MD  Procedure: Injection  Leqvio (inclisiran), Dose: 284 mg , Site: subcutaneous, Number of injections: 1  30 minute observation completed  Discharge: Condition: Good, Destination: Home . AVS provided to patient.   Performed by:  Chancey Ringel E Freddi Forster, LPN       

## 2022-09-06 ENCOUNTER — Other Ambulatory Visit: Payer: Medicare Other

## 2022-09-06 DIAGNOSIS — M25552 Pain in left hip: Secondary | ICD-10-CM | POA: Insufficient documentation

## 2022-09-12 ENCOUNTER — Ambulatory Visit: Payer: Medicare Other | Admitting: Cardiology

## 2022-10-03 ENCOUNTER — Ambulatory Visit: Payer: Medicare Other

## 2022-10-03 DIAGNOSIS — R55 Syncope and collapse: Secondary | ICD-10-CM

## 2022-10-04 ENCOUNTER — Other Ambulatory Visit: Payer: Medicare Other

## 2022-10-10 ENCOUNTER — Ambulatory Visit: Payer: Medicare Other | Admitting: Cardiology

## 2022-11-06 ENCOUNTER — Ambulatory Visit: Payer: Medicare Other | Admitting: Cardiology

## 2022-11-06 ENCOUNTER — Encounter: Payer: Self-pay | Admitting: Cardiology

## 2022-11-06 VITALS — BP 150/78 | HR 75 | Ht 65.0 in | Wt 220.2 lb

## 2022-11-06 DIAGNOSIS — I739 Peripheral vascular disease, unspecified: Secondary | ICD-10-CM

## 2022-11-06 DIAGNOSIS — I10 Essential (primary) hypertension: Secondary | ICD-10-CM

## 2022-11-06 DIAGNOSIS — E782 Mixed hyperlipidemia: Secondary | ICD-10-CM

## 2022-11-06 DIAGNOSIS — I5032 Chronic diastolic (congestive) heart failure: Secondary | ICD-10-CM

## 2022-11-06 NOTE — Progress Notes (Signed)
Patient referred by Dr. Jilda Panda for coronary artery disease, carotid bruit  Subjective:   [place], female    DOB: 1956-09-19, 66 y.o.   MRN: [id number]    Chief Complaint  Patient presents with   Leg Pain     HPI  66 y.o. Caucasian female with hypertension, hyperlipidemia, hypothyroidism, multiple sclerosis, CAD, PAD  Patient is here today with her sister. She has bilateral leg pain, more at rest, does not worsen with exertion. Blood pressure elevated today, has been lower in the past.     Current Outpatient Medications:    acetaminophen (TYLENOL) 650 MG CR tablet, Take 1,300 mg by mouth daily., Disp: , Rfl:    albuterol (VENTOLIN HFA) 108 (90 Base) MCG/ACT inhaler, Inhale 2 puffs into the lungs every 4 (four) hours as needed for wheezing or shortness of breath., Disp: , Rfl:    aspirin 81 MG EC tablet, Take 1 tablet (81 mg total) by mouth daily., Disp: 30 tablet, Rfl: 2   empagliflozin (JARDIANCE) 10 MG TABS tablet, Take 1 tablet (10 mg total) by mouth daily before breakfast., Disp: 30 tablet, Rfl: 3   Fluticasone-Umeclidin-Vilant (TRELEGY ELLIPTA) 100-62.5-25 MCG/ACT AEPB, Inhale into the lungs., Disp: , Rfl:    furosemide (LASIX) 40 MG tablet, TAKE 1 TABLET BY MOUTH DAILY, Disp: 90 tablet, Rfl: 0   gabapentin (NEURONTIN) 600 MG tablet, Take 1 tablet (600 mg total) by mouth 3 (three) times daily., Disp: 90 tablet, Rfl: 11   levothyroxine (SYNTHROID) 25 MCG tablet, Take 25 mcg by mouth daily., Disp: , Rfl:    losartan (COZAAR) 50 MG tablet, Take 50 mg by mouth daily., Disp: , Rfl:    montelukast (SINGULAIR) 10 MG tablet, Take 10 mg by mouth daily., Disp: , Rfl:    ondansetron (ZOFRAN-ODT) 4 MG disintegrating tablet, Take 1 tablet (4 mg total) by mouth every 8 (eight) hours as needed for nausea or vomiting., Disp: 20 tablet, Rfl: 0   pantoprazole (PROTONIX) 40 MG tablet, Take 40 mg by mouth daily., Disp: , Rfl:    busPIRone (BUSPAR) 15 MG tablet, Take 1 tablet (15  mg total) by mouth 2 (two) times daily., Disp: 60 tablet, Rfl: 5   cetirizine (ZYRTEC) 10 MG tablet, Take 10 mg by mouth daily. (Patient not taking: Reported on 11/06/2022), Disp: , Rfl:    DULoxetine (CYMBALTA) 60 MG capsule, TAKE 1 CAPSULE(60 MG) BY MOUTH DAILY (Patient not taking: Reported on 11/06/2022), Disp: 30 capsule, Rfl: 5   Red Yeast Rice 600 MG CAPS, Take 1 capsule by mouth daily. (Patient not taking: Reported on 11/06/2022), Disp: , Rfl:     Cardiovascular and other pertinent studies:  Echocardiogram 10/03/2022:  Left ventricle cavity is normal in size and wall thickness. Normal global  wall motion. Normal LV systolic function with EF 62%. Doppler evidence of  grade I (impaired) diastolic dysfunction, normal LAP.  No significant valvular abnormality.  No evidence of pulmonary hypertension.  Following findings noted on 02/07/2021 not appreciated on this study: Mild  left atrial dilatation, mild MR, dilated IVC.   Mobile cardiac telemetry 13 days 08/20/2022 - 09/03/2022: Dominant rhythm: Sinus. HR 45-139 bpm. Avg HR 73 bpm, in sinus rhythm. 15 episodes of SVT, fastest at 211 bpm for 17 beats, longest for 9.3 secs at 138 bpm. <1% isolated SVE,  couplet/triplets. 0 episodes of VT. <1% isolated VE, no couplet/triplets. No atrial fibrillation/atrial flutter/VT/high grade AV block, sinus pause >3sec noted. 3 patient triggered events, did not  correlate with SVT< correlated with sinus rhythm/artifact.   EKG 07/12/2022: Sinus rhythm 54 bpm Nonspecific T wave abnormality  ABI 03/06/2022:  This exam reveals mildly decreased perfusion of the right lower extremity,  noted at the dorsalis pedis artery level (ABI 0.81) with severely abnormal  dampened monophasic waveform pattern at the right ankle.    This exam reveals normal perfusion of the left lower extremity (ABI 1.01)  with mildly abnormal biphasic waveform pattern at the left ankle.  Compared to the study done on 02/07/2021, no  change in the right ABI, left  ABI has improved from 0.62.  Abdominal aortogram with lower extremity intervention 02/12/2022: Patent b/l aortoiliac arteries with mild disease Flush occluded Lt SFA CTO, reconstituting in distal SFA through collaterals from profunda Two vessel below the knee run-off with occluded ATA   Complex peripheral intervention: Directional atherectomy Lt SFA     PTA and overlapping stenting Lt SFA     Eluvia drug eluting stents 6.0X150 cm, 6.0X150 cm, 6.0X60 mm     Post dilatation with 6.0 mm balloons up to 24 atm  Renal artery duplex 01/08/2022: 1. Technically challenging examination secondary to patient body habitus. CT or MR arteriogram may be more sensitive for the detection of renal artery stenosis. 2. No convincing evidence of hemodynamically significant renal artery stenosis by duplex ultrasound. 3. Renal size discrepancy. The left kidney is smaller than the right.  Carotid artery duplex 10/04/2021:  Duplex suggests stenosis in the right internal carotid artery (16-49%).  Duplex suggests stenosis in the right external carotid artery (<50%).  Heterogeneous plaque noted.  Duplex suggests stenosis in the left internal carotid artery (50-69%).  Duplex suggests stenosis in the left external carotid artery (<50%).  Homogeneous plaque noted.  Antegrade right vertebral artery flow. Antegrade left vertebral artery flow.  Compared to the study done on 03/27/2021, right ICA stenosis severity has reduced from 50 to 69% to the present less than 50%. Follow up in six months is appropriate if clinically indicated.  Lower Extremity Arterial Duplex 05/25/2021: Moderate velocity increase at the right distal superficial femoral artery suggests >50% stenosis. Duplex suggests >70-80% by plaque morphology (severe diffuse heterogeneous plaque noted in the right lower extremity). Moderate velocity increase at the left distal superficial femoral artery, duplex suggests probably near  occlusion. Diffuse heterogeneous plaque throughout the left lower extremity. This exam reveals mildly decreased perfusion of the right lower extremity, noted at the dorsalis pedis artery level (ABI0.80) and moderately decreased perfusion of the left lower extremity, noted at the post tibial artery level (ABI 0.64) with markedly abnormal monophasic waveform at the level of bilateral ankles.  CT Chest 08/15/2020: 1. Lung-RADS 2, benign appearance or behavior. Continue annual screening with low-dose chest CT without contrast in 12 months. 2. Three-vessel coronary atherosclerosis. 3. Aortic Atherosclerosis (ICD10-I70.0) and Emphysema (ICD10-J43.9).   Recent labs: 10/29/2022: H/H 11/35. MCV 90. Platelets 229 Chol 162, TG 148, HDL 53, LDL 79  08/09/2022: Glucose 84, BUN/Cr 23/1.48. EGFR 39. Na/K 144/4.8.  BNP 170 Chol 215, TG 89, HDL 56, LDL 143  03/2022: Hb 10.3. BUN/Cr 26/1.6. eGFR 36.  Chol 141, TG 94, HDL 41, LDL 81  07/27/2020, 11/03/2020 Glucose 83, BUN/Cr 16/1.25. EGFR 45. Na/K 142/5.2 . Rest of the CMP normal H/H 12.9/39.7. MCV 95.4. Platelets 182 HbA1C N/A Chol 241, TG 123, HDL 51, LDL 165 TSH 1.79 normal   Review of Systems  Cardiovascular:  Positive for claudication (improving). Negative for chest pain, dyspnea on exertion, leg swelling, palpitations  and syncope.  Musculoskeletal:        B/l feet pain - improving       Vitals:   11/06/22 1450 11/06/22 1451  BP:    Pulse:    SpO2: 98% 98%   Orthostatic VS for the past 72 hrs (Last 3 readings):  Orthostatic BP Patient Position BP Location Cuff Size Orthostatic Pulse  11/06/22 1452 153/85 Standing Left Arm Large 73  11/06/22 1451 148/62 Sitting Left Arm Large 64  11/06/22 1450 170/65 Supine Left Arm Large 74     Body mass index is 36.64 kg/m. Filed Weights   11/06/22 1447  Weight: 220 lb 3.2 oz (99.9 kg)     Objective:   Physical Exam Vitals reviewed.  Constitutional:      General: She is not in  acute distress. Neck:     Vascular: No JVD.  Cardiovascular:     Rate and Rhythm: Normal rate and regular rhythm.     Pulses:          Femoral pulses are 2+ on the right side and 2+ on the left side.      Popliteal pulses are 1+ on the right side and 2+ on the left side.       Dorsalis pedis pulses are 1+ on the right side and 2+ on the left side.       Posterior tibial pulses are 0 on the right side and 1+ on the left side.     Heart sounds: Normal heart sounds. No murmur heard. Pulmonary:     Effort: Pulmonary effort is normal.     Breath sounds: Normal breath sounds. No wheezing or rales.  Musculoskeletal:     Right lower leg: No edema.     Left lower leg: No edema.  Feet:     Right foot:     Skin integrity: Dry skin present. No ulcer.     Toenail Condition: Right toenails are abnormally thick.     Left foot:     Skin integrity: Dry skin present. No ulcer.     Toenail Condition: Left toenails are abnormally thick.       ICD-10-CM   1. PAD (peripheral artery disease) (HCC)  I73.9 PCV LOWER ARTERIAL (BILATERAL)    CANCELED: PCV LOWER ARTERIAL (BILATERAL)    2. Chronic heart failure with preserved ejection fraction (HCC)  I50.32     3. Essential hypertension  I10     4. Mixed hyperlipidemia  E78.2         Assessment & Recommendations:   66 y.o. Caucasian female with hypertension, hyperlipidemia, hypothyroidism, multiple sclerosis, CAD, PAD, syncope, HFpEF  HFpEF: B/l leg edema resolved. Continue Jardiance 10 mg daily. Can stop lasix 40 mg daily in future, if has episodes of hypotension.  Syncope: No recurrence. No significant arrhythmia on monitor.  PAD: S/p intervention to left SFA with atherectomy and overlapping drug-eluting stents (01/2022) Continue aspirin 81 mg daily. Current leg pain less likely due to PAD> Will check LEA duplex.  CAD: Seen on CT chest 07/2020 No angina symptoms at this time.  Continue medical management. See below regarding lipid  management.  Mixed hyperlipidemia: LDL down to 79. Continue Leqvio.  Hypertension: Reasonably well controlled.  Carotid artery disease: Asymptomatic, moderate, bilateral  F/u in 12/2022.    Nigel Mormon, MD Pager: [phone] Office: [phone]

## 2022-11-08 ENCOUNTER — Other Ambulatory Visit: Payer: Self-pay | Admitting: Internal Medicine

## 2022-11-08 DIAGNOSIS — Z1231 Encounter for screening mammogram for malignant neoplasm of breast: Secondary | ICD-10-CM

## 2022-11-19 ENCOUNTER — Other Ambulatory Visit: Payer: Self-pay

## 2022-11-19 DIAGNOSIS — I2510 Atherosclerotic heart disease of native coronary artery without angina pectoris: Secondary | ICD-10-CM

## 2022-11-19 DIAGNOSIS — E782 Mixed hyperlipidemia: Secondary | ICD-10-CM

## 2022-11-19 MED ORDER — LEQVIO 284 MG/1.5ML ~~LOC~~ SOSY: 284.0000 mg | PREFILLED_SYRINGE | Freq: Once | SUBCUTANEOUS | 0 refills | Status: AC

## 2022-11-19 NOTE — Progress Notes (Signed)
Patient receiving Leqvio injections at [facility] at [facility].   Injectio 1: 09/05/2022  Injection 2: scheduled for 12/19/2022

## 2022-11-20 ENCOUNTER — Ambulatory Visit: Payer: Medicare Other

## 2022-11-20 DIAGNOSIS — I739 Peripheral vascular disease, unspecified: Secondary | ICD-10-CM

## 2022-11-20 DIAGNOSIS — Z95820 Peripheral vascular angioplasty status with implants and grafts: Secondary | ICD-10-CM

## 2022-12-05 ENCOUNTER — Ambulatory Visit (INDEPENDENT_AMBULATORY_CARE_PROVIDER_SITE_OTHER): Payer: Medicare Other

## 2022-12-05 ENCOUNTER — Other Ambulatory Visit: Payer: Self-pay | Admitting: Neurology

## 2022-12-05 VITALS — BP 154/78 | HR 50 | Temp 97.7°F | Resp 18 | Ht 65.0 in | Wt 218.2 lb

## 2022-12-05 DIAGNOSIS — E782 Mixed hyperlipidemia: Secondary | ICD-10-CM | POA: Diagnosis not present

## 2022-12-05 MED ORDER — INCLISIRAN SODIUM 284 MG/1.5ML ~~LOC~~ SOSY
284.0000 mg | PREFILLED_SYRINGE | Freq: Once | SUBCUTANEOUS | Status: AC
  Administered 2022-12-05: 284 mg via SUBCUTANEOUS
  Filled 2022-12-05: qty 1.5

## 2022-12-05 NOTE — Progress Notes (Signed)
Diagnosis: Hyperlipidemia  Provider:  Chilton Greathouse MD  Procedure: Infusion  Leqvio (inclisiran), Dose: 284 mg, Site: subcutaneous, Number of injections: 1  Post Care:     Discharge: Condition: Good, Destination: Home . AVS provided to patient.   Performed by:  Garnette Czech, RN

## 2022-12-11 NOTE — Progress Notes (Signed)
It  is already there right? Lower Extremity Arterial Duplex 11/20/2022: Markedly elevated right EIA and CFA velocity of >400 cm/sec suggests hemodynamically significant >50% stenosis. The right lower extremity waveforms demonstrate monophasic flow pattern through out the right lower extremity suggesting proximal significant stenosis. Left proximal superficial femoral artery to mid superficial femoral artery patent stent noted. Ankle pressures not obtained due to patient having pain in ankles and patient request. Compared to the study done on 05/25/2021, no change in right lower extremity stenosis. Left SFA near occlusion is now patent with stent. Prior ABI on the right was 0.80, left was 0.64

## 2022-12-12 ENCOUNTER — Ambulatory Visit (INDEPENDENT_AMBULATORY_CARE_PROVIDER_SITE_OTHER): Payer: Medicare Other | Admitting: Podiatry

## 2022-12-12 ENCOUNTER — Encounter: Payer: Self-pay | Admitting: Podiatry

## 2022-12-12 DIAGNOSIS — L603 Nail dystrophy: Secondary | ICD-10-CM

## 2022-12-12 DIAGNOSIS — L600 Ingrowing nail: Secondary | ICD-10-CM | POA: Diagnosis not present

## 2022-12-12 MED ORDER — NEOMYCIN-POLYMYXIN-HC 1 % OT SOLN: OTIC | 1 refills | Status: DC

## 2022-12-12 NOTE — Patient Instructions (Signed)

## 2022-12-12 NOTE — Progress Notes (Signed)
She presents today for request of removing her painful hallux nails bilaterally.  States they have been painful for quite some time they are thick they are hard to cut and they irritate her other toes.  Objective: Vital signs stable alert oriented x 3.  Pulses are palpable.  Capillary fill time is immediate.  Hallux nails bilaterally are thick dystrophic irregularly shaped and painful on palpation.  Assessment: Ingrown nail nail dystrophy hallux bilaterally.  Plan: Chemical matricectomy was performed today after local anesthetic was administered.  Tolerated procedure well without complications.  Was given both oral and home-going instruction for care and soaking of the toe as well as a prescription for Cortisporin Otic to be applied twice daily after soaking.  I will follow-up with her in 2 weeks.

## 2022-12-26 ENCOUNTER — Ambulatory Visit (INDEPENDENT_AMBULATORY_CARE_PROVIDER_SITE_OTHER): Payer: Medicare Other | Admitting: Podiatry

## 2022-12-26 DIAGNOSIS — L600 Ingrowing nail: Secondary | ICD-10-CM

## 2022-12-26 DIAGNOSIS — Z9889 Other specified postprocedural states: Secondary | ICD-10-CM

## 2022-12-26 NOTE — Progress Notes (Signed)
She presents today for a follow-up of her matrixectomy hallux bilaterally.  States that they are doing pretty well states that she has had some swelling in the toes has been soaking in Betadine.  Objective: Vital signs stable alert oriented x 3 they are still mildly overall but there is no erythema cellulitis drainage or odor may be some contact dermatitis from the Betadine.  However there are skin buds with epithelialization occurring along the nailbed.  Assessment: Well-healing surgical matricectomy's bilateral.  Plan: Continue to soak once daily Epsom salts warm water discontinue Betadine and continue to soak until completely resolved no redness draining or pain.  Follow-up with me with any concerns.

## 2023-01-01 ENCOUNTER — Ambulatory Visit
Admission: RE | Admit: 2023-01-01 | Discharge: 2023-01-01 | Disposition: A | Discharge status: Home / Self Care | Payer: Medicare Other | Source: Ambulatory Visit | Attending: Internal Medicine | Admitting: Internal Medicine

## 2023-01-01 DIAGNOSIS — Z1231 Encounter for screening mammogram for malignant neoplasm of breast: Secondary | ICD-10-CM

## 2023-01-08 ENCOUNTER — Encounter: Payer: Self-pay | Admitting: Cardiology

## 2023-01-08 ENCOUNTER — Ambulatory Visit: Payer: Medicare Other | Admitting: Cardiology

## 2023-01-08 VITALS — BP 141/78 | HR 68 | Resp 16 | Ht 65.0 in | Wt 201.0 lb

## 2023-01-08 DIAGNOSIS — I739 Peripheral vascular disease, unspecified: Secondary | ICD-10-CM

## 2023-01-08 DIAGNOSIS — I2510 Atherosclerotic heart disease of native coronary artery without angina pectoris: Secondary | ICD-10-CM

## 2023-01-08 DIAGNOSIS — E782 Mixed hyperlipidemia: Secondary | ICD-10-CM

## 2023-01-08 MED ORDER — CILOSTAZOL 50 MG PO TABS: 50.0000 mg | ORAL_TABLET | Freq: Two times a day (BID) | ORAL | 3 refills | Status: DC

## 2023-01-08 NOTE — Progress Notes (Signed)
Patient referred by Dr. Jilda Panda for coronary artery disease, carotid bruit  Subjective:   Monica Park, female    DOB: 08-20-1956, 67 y.o.   MRN: [id number]    Chief Complaint  Patient presents with   PAD   Follow-up    2 month     HPI  67 y.o. Caucasian female with hypertension, hyperlipidemia, hypothyroidism, multiple sclerosis, CAD, PAD  Patient is here with his sister today.  She continues to have severe pain in her right leg, both with walking, as well as at rest.  She is able to walk better 50 feet before having to stop due to leg pain.  She recently underwent toenail removal on the right foot, and fortunately is healing well.  Reviewed recent test results with the patient, details below.     Current Outpatient Medications:    acetaminophen (TYLENOL) 650 MG CR tablet, Take 1,300 mg by mouth daily., Disp: , Rfl:    albuterol (VENTOLIN HFA) 108 (90 Base) MCG/ACT inhaler, Inhale 2 puffs into the lungs every 4 (four) hours as needed for wheezing or shortness of breath., Disp: , Rfl:    aspirin 81 MG EC tablet, Take 1 tablet (81 mg total) by mouth daily., Disp: 30 tablet, Rfl: 2   cetirizine (ZYRTEC) 10 MG tablet, Take 10 mg by mouth daily., Disp: , Rfl:    cilostazol (PLETAL) 50 MG tablet, Take 1 tablet (50 mg total) by mouth 2 (two) times daily., Disp: 60 tablet, Rfl: 3   empagliflozin (JARDIANCE) 10 MG TABS tablet, Take 1 tablet (10 mg total) by mouth daily before breakfast., Disp: 30 tablet, Rfl: 3   Fluticasone-Umeclidin-Vilant (TRELEGY ELLIPTA) 100-62.5-25 MCG/ACT AEPB, Inhale into the lungs., Disp: , Rfl:    furosemide (LASIX) 40 MG tablet, TAKE 1 TABLET BY MOUTH DAILY, Disp: 90 tablet, Rfl: 0   gabapentin (NEURONTIN) 600 MG tablet, TAKE ONE TABLET BY MOUTH THREE TIMES A DAY, Disp: 90 tablet, Rfl: 1   levothyroxine (SYNTHROID) 25 MCG tablet, Take 25 mcg by mouth daily., Disp: , Rfl:    losartan (COZAAR) 50 MG tablet, Take 50 mg by mouth daily., Disp: , Rfl:     montelukast (SINGULAIR) 10 MG tablet, Take 10 mg by mouth daily., Disp: , Rfl:    NEOMYCIN-POLYMYXIN-HYDROCORTISONE (CORTISPORIN) 1 % SOLN OTIC solution, Apply 1-2 drops to toe BID after soaking, Disp: 10 mL, Rfl: 1   ondansetron (ZOFRAN-ODT) 4 MG disintegrating tablet, Take 1 tablet (4 mg total) by mouth every 8 (eight) hours as needed for nausea or vomiting., Disp: 20 tablet, Rfl: 0   pantoprazole (PROTONIX) 40 MG tablet, Take 40 mg by mouth daily., Disp: , Rfl:     Cardiovascular and other pertinent studies:  EKG 01/08/2023: Sinus rhythm 60 bpm Normal EKG  Lower Extremity Arterial Duplex 11/20/2022:  Markedly elevated right EIA and CFA velocity of >400 cm/sec suggests  hemodynamically significant >50% stenosis.  The right lower extremity waveforms demonstrate monophasic flow pattern  through out the right lower extremity suggesting  proximal significant stenosis.  Left proximal superficial femoral artery to mid superficial femoral artery  patent stent noted.  Ankle pressures not obtained due to patient having pain in ankles and  patient request.  Compared to the study done on 05/25/2021, no change in right lower extremity  stenosis. Left SFA near occlusion is now patent with stent.  Prior ABI on the right was 0.80, left was 0.64   Echocardiogram 10/03/2022:  Left ventricle cavity is normal  in size and wall thickness. Normal global  wall motion. Normal LV systolic function with EF 62%. Doppler evidence of  grade I (impaired) diastolic dysfunction, normal LAP.  No significant valvular abnormality.  No evidence of pulmonary hypertension.  Following findings noted on 02/07/2021 not appreciated on this study: Mild  left atrial dilatation, mild MR, dilated IVC.   Mobile cardiac telemetry 13 days 08/20/2022 - 09/03/2022: Dominant rhythm: Sinus. HR 45-139 bpm. Avg HR 73 bpm, in sinus rhythm. 15 episodes of SVT, fastest at 211 bpm for 17 beats, longest for 9.3 secs at 138 bpm. <1%  isolated SVE,  couplet/triplets. 0 episodes of VT. <1% isolated VE, no couplet/triplets. No atrial fibrillation/atrial flutter/VT/high grade AV block, sinus pause >3sec noted. 3 patient triggered events, did not correlate with SVT< correlated with sinus rhythm/artifact.   Abdominal aortogram with lower extremity intervention 02/12/2022: Patent b/l aortoiliac arteries with mild disease Flush occluded Lt SFA CTO, reconstituting in distal SFA through collaterals from profunda Two vessel below the knee run-off with occluded ATA   Complex peripheral intervention: Directional atherectomy Lt SFA     PTA and overlapping stenting Lt SFA     Eluvia drug eluting stents 6.0X150 cm, 6.0X150 cm, 6.0X60 mm     Post dilatation with 6.0 mm balloons up to 24 atm  Renal artery duplex 01/08/2022: 1. Technically challenging examination secondary to patient body habitus. CT or MR arteriogram may be more sensitive for the detection of renal artery stenosis. 2. No convincing evidence of hemodynamically significant renal artery stenosis by duplex ultrasound. 3. Renal size discrepancy. The left kidney is smaller than the right.  Carotid artery duplex 10/04/2021:  Duplex suggests stenosis in the right internal carotid artery (16-49%).  Duplex suggests stenosis in the right external carotid artery (<50%).  Heterogeneous plaque noted.  Duplex suggests stenosis in the left internal carotid artery (50-69%).  Duplex suggests stenosis in the left external carotid artery (<50%).  Homogeneous plaque noted.  Antegrade right vertebral artery flow. Antegrade left vertebral artery flow.  Compared to the study done on 03/27/2021, right ICA stenosis severity has reduced from 50 to 69% to the present less than 50%. Follow up in six months is appropriate if clinically indicated.   Recent labs: 09/30/2022: Glucose 89, BUN/Cr 33/1.68.   08/09/2022: Glucose 84, BUN/Cr 23/1.48. EGFR 39. Na/K 144/4.8.  BNP 170 Chol 215, TG 89, HDL  56, LDL 143  03/2022: Hb 10.3. BUN/Cr 26/1.6. eGFR 36.  Chol 141, TG 94, HDL 41, LDL 81  07/27/2020, 11/03/2020 Glucose 83, BUN/Cr 16/1.25. EGFR 45. Na/K 142/5.2 . Rest of the CMP normal H/H 12.9/39.7. MCV 95.4. Platelets 182 HbA1C N/A Chol 241, TG 123, HDL 51, LDL 165 TSH 1.79 normal   Review of Systems  Cardiovascular:  Positive for claudication (improving). Negative for chest pain, dyspnea on exertion, leg swelling, palpitations and syncope.  Musculoskeletal:        B/l feet pain - improving       Vitals:   01/08/23 1254  BP: (!) 141/78  Pulse: 68  Resp: 16  SpO2: 97%   Orthostatic VS for the past 72 hrs (Last 3 readings):  Patient Position BP Location Cuff Size  01/08/23 1254 Sitting Left Arm Large     Body mass index is 33.45 kg/m. Filed Weights   01/08/23 1254  Weight: 201 lb (91.2 kg)     Objective:   Physical Exam Vitals reviewed.  Constitutional:      General: She is not in acute distress.  Neck:     Vascular: No JVD.  Cardiovascular:     Rate and Rhythm: Normal rate and regular rhythm.     Pulses:          Femoral pulses are 1+ on the right side and 1+ on the left side.      Popliteal pulses are 1+ on the right side and 1+ on the left side.       Dorsalis pedis pulses are 0 on the right side and 1+ on the left side.       Posterior tibial pulses are 0 on the right side and 1+ on the left side.     Heart sounds: Normal heart sounds. No murmur heard. Pulmonary:     Effort: Pulmonary effort is normal.     Breath sounds: Normal breath sounds. No wheezing or rales.  Musculoskeletal:     Right lower leg: No edema.     Left lower leg: No edema.  Feet:     Right foot:     Skin integrity: Dry skin present. No ulcer.     Toenail Condition: Right toenails are abnormally thick.     Left foot:     Skin integrity: Dry skin present. No ulcer.     Toenail Condition: Left toenails are abnormally thick.     No diagnosis found.     Assessment &  Recommendations:   67 y.o. Caucasian female with hypertension, hyperlipidemia, hypothyroidism, multiple sclerosis, CAD, PAD, syncope, HFpEF  PAD: S/p intervention to left SFA with atherectomy and overlapping drug-eluting stents (01/2022) Now resting leg pain with severe multi level disease, especially right inflow disease. continue aspirin 81 mg daily.  Added Pletal 50 mg twice daily. Encourage walking. If symptoms do not improve, could consider lower extremity angiogram, abdominal aortogram, and possible intervention. In the meantime, I have encouraged her to consider options for weight loss to reduce her periprocedural bleeding risk.  Obviously, weight loss would also help improve her overall cardiovascular health in general.  Could consider weight loss medications.  Patient will discuss with Dr. Mellody Drown.  HFpEF: Euvolemic today.  Continue Jardiance.  Syncope: No recurrence. No significant arrhythmia on monitor.  CAD: Seen on CT chest 07/2020 No angina symptoms at this time.  Continue medical management. See below regarding lipid management.  Mixed hyperlipidemia: Continue Leqvio. Check lipid panel.  Hypertension: Reasonably well controlled.  Carotid artery disease: Asymptomatic, moderate, bilateral  F/u in 3 months    Nigel Mormon, MD Pager: [phone] Office: [phone]

## 2023-01-09 LAB — LIPID PANEL
Chol/HDL Ratio: 2.7 ratio (ref 0.0–4.4)
Cholesterol, Total: 152 mg/dL (ref 100–199)
HDL: 56 mg/dL (ref >39)
LDL Chol Calc (NIH): 76 mg/dL (ref 0–99)
Triglycerides: 109 mg/dL (ref 0–149)
VLDL Cholesterol Cal: 20 mg/dL (ref 5–40)

## 2023-01-09 LAB — BASIC METABOLIC PANEL
BUN/Creatinine Ratio: 15 (ref 12–28)
BUN: 23 mg/dL (ref 8–27)
CO2: 24 mmol/L (ref 20–29)
Calcium: 10.1 mg/dL (ref 8.7–10.3)
Chloride: 101 mmol/L (ref 96–106)
Creatinine, Ser: 1.52 mg/dL — ABNORMAL HIGH (ref 0.57–1.00)
Glucose: 92 mg/dL (ref 70–99)
Potassium: 5.3 mmol/L — ABNORMAL HIGH (ref 3.5–5.2)
Sodium: 144 mmol/L (ref 134–144)
eGFR: 37 mL/min/{1.73_m2} — ABNORMAL LOW (ref >59)

## 2023-01-13 ENCOUNTER — Encounter: Payer: Self-pay | Admitting: Cardiology

## 2023-01-14 ENCOUNTER — Ambulatory Visit (INDEPENDENT_AMBULATORY_CARE_PROVIDER_SITE_OTHER): Payer: 59 | Admitting: Podiatry

## 2023-01-14 ENCOUNTER — Encounter: Payer: Self-pay | Admitting: Podiatry

## 2023-01-14 DIAGNOSIS — L03031 Cellulitis of right toe: Secondary | ICD-10-CM

## 2023-01-14 DIAGNOSIS — L03032 Cellulitis of left toe: Secondary | ICD-10-CM

## 2023-01-14 MED ORDER — DOXYCYCLINE HYCLATE 100 MG PO TABS: 100.0000 mg | ORAL_TABLET | Freq: Two times a day (BID) | ORAL | 0 refills | Status: DC

## 2023-01-14 NOTE — Progress Notes (Signed)
She presents today for follow-up of her hallux bilaterally.  Nail avulsions and matrixectomy's were performed back in December and she states that is still quite tender.  She states that she soaks twice daily and covers with Band-Aids.  Objective: Vital signs stable she alert and oriented x 3.  The toes are a little bit red and do have some fibrin deposition though there is granulation tissue on that is present with epithelization in the areas without the fibrin.  I do not see any purulence and there is no malodor.  Assessment: Probable bacterial overload resulting in delayed healing of the great toes.  Plan: I will recommend that she discontinue soaking twice a day every day and start with once a day or once every other day soaks in Epsom salts and warm water.  She will continue the antibiotic and cover during the day but leave open at bedtime.  Also will start her on doxycycline for the next 10 days 1 tablet twice a day and I will follow-up with her in about 3 weeks.

## 2023-02-02 ENCOUNTER — Other Ambulatory Visit: Payer: Self-pay | Admitting: Cardiology

## 2023-02-03 ENCOUNTER — Other Ambulatory Visit: Payer: Self-pay | Admitting: *Deleted

## 2023-02-12 ENCOUNTER — Encounter: Payer: Self-pay | Admitting: Cardiology

## 2023-02-20 ENCOUNTER — Ambulatory Visit: Payer: Medicare Other | Admitting: Cardiology

## 2023-02-26 ENCOUNTER — Encounter: Payer: Self-pay | Admitting: Cardiology

## 2023-03-05 ENCOUNTER — Ambulatory Visit (INDEPENDENT_AMBULATORY_CARE_PROVIDER_SITE_OTHER): Payer: 59 | Admitting: Nurse Practitioner

## 2023-03-05 ENCOUNTER — Encounter: Payer: Self-pay | Admitting: Nurse Practitioner

## 2023-03-05 VITALS — BP 116/73 | HR 57 | Temp 98.0°F | Ht 65.0 in | Wt 216.0 lb

## 2023-03-05 DIAGNOSIS — E669 Obesity, unspecified: Secondary | ICD-10-CM

## 2023-03-05 DIAGNOSIS — Z6835 Body mass index (BMI) 35.0-35.9, adult: Secondary | ICD-10-CM

## 2023-03-05 DIAGNOSIS — I10 Essential (primary) hypertension: Secondary | ICD-10-CM | POA: Diagnosis not present

## 2023-03-05 DIAGNOSIS — Z0289 Encounter for other administrative examinations: Secondary | ICD-10-CM

## 2023-03-05 NOTE — Progress Notes (Signed)
Office: [phone]  /  Fax: [phone]   Initial Visit  Monica Park was seen in clinic today to evaluate for obesity. She is interested in losing weight to improve overall health and reduce the risk of weight related complications. She presents today to review program treatment options, initial physical assessment, and evaluation.     She was referred by: Specialist  When asked what else they would like to accomplish? She states: Adopt healthier eating patterns, Improve energy levels and physical activity, Improve existing medical conditions, Reduce number of medications, Improve quality of life, and Lose a target amount of weight : Goal weight:  150 lbs   Goal is to lose weight to proceed with surgery.    Weight history:  She started gaining weight after having her children.  Her weight has fluctuated over the years.   When asked how has your weight affected you? She states: Has affected self-esteem, Contributed to medical problems, Contributed to orthopedic problems or mobility issues, Having fatigue, Having poor endurance, and Problems with depression and or anxiety  Some associated conditions: Hypertension, Hyperlipidemia, Fatty liver disease, Heart disease, Lung disease, Kidney disease, and Vitamin D Deficiency  Contributing factors: Family history, Medications, Pregnancy, and Menopause  Weight promoting medications identified: Steroids  Current nutrition plan: None  Current level of physical activity: Other: chair exercises  Current or previous pharmacotherapy: Other: Took OTC meds in the past  Response to medication: Lost weight but gained the weight back    Past medical history includes:   Past Medical History:  Diagnosis Date   Anxiety    Hypertension    Migraine      Objective:   BP 116/73   Pulse (!) 57   Temp 98 F (36.7 C)   Ht '5\' 5"'$  (1.651 m)   Wt 216 lb (98 kg)   SpO2 96%   BMI 35.94 kg/m  She was weighed on the bioimpedance scale: Body mass  index is 35.94 kg/m.  Peak Weight:285 lbs , Body Fat%:49.4, Visceral Fat Rating:15, Weight trend over the last 12 months: fluctuated  General:  Alert, oriented and cooperative. Patient is in no acute distress.  Respiratory: Normal respiratory effort, no problems with respiration noted   Gait: able to ambulate independently  Mental Status: Normal mood and affect. Normal behavior. Normal judgment and thought content.   DIAGNOSTIC DATA REVIEWED:  BMET    Component Value Date/Time   NA 144 01/08/2023 1508   K 5.3 (H) 01/08/2023 1508   CL 101 01/08/2023 1508   CO2 24 01/08/2023 1508   GLUCOSE 92 01/08/2023 1508   GLUCOSE 121 (H) 02/14/2022 0607   BUN 23 01/08/2023 1508   CREATININE 1.52 (H) 01/08/2023 1508   CALCIUM 10.1 01/08/2023 1508   GFRNONAA 40 (L) 02/14/2022 0607   No results found for: "HGBA1C" No results found for: "INSULIN" CBC    Component Value Date/Time   WBC 8.2 02/18/2022 0905   WBC 10.5 02/14/2022 0607   RBC 3.10 (L) 02/18/2022 0905   RBC 2.49 (L) 02/14/2022 0607   HGB 9.3 (L) 02/18/2022 0905   HCT 27.7 (L) 02/18/2022 0905   PLT 308 02/18/2022 0905   MCV 89 02/18/2022 0905   MCH 30.0 02/18/2022 0905   MCH 30.1 02/14/2022 0607   MCHC 33.6 02/18/2022 0905   MCHC 32.2 02/14/2022 0607   RDW 13.7 02/18/2022 0905   Iron/TIBC/Ferritin/ %Sat No results found for: "IRON", "TIBC", "FERRITIN", "IRONPCTSAT" Lipid Panel     Component Value Date/Time  CHOL 152 01/08/2023 1508   TRIG 109 01/08/2023 1508   HDL 56 01/08/2023 1508   CHOLHDL 2.7 01/08/2023 1508   CHOLHDL 3.4 02/13/2022 0211   VLDL 19 02/13/2022 0211   LDLCALC 76 01/08/2023 1508   Hepatic Function Panel     Component Value Date/Time   PROT 6.0 (L) 02/13/2022 0211   ALBUMIN 2.8 (L) 02/13/2022 0211   AST 14 (L) 02/13/2022 0211   ALT 11 02/13/2022 0211   ALKPHOS 69 02/13/2022 0211   BILITOT 0.3 02/13/2022 0211      Component Value Date/Time   TSH 1.800 04/23/2022 1634     Assessment and  Plan:   Essential hypertension Continue to follow-up with PCP and cardiology.  Continue medications as directed.  Generalized obesity  BMI 35.0-35.9,adult        Obesity Treatment / Action Plan:  Patient will work on garnering support from family and friends to begin weight loss journey. Will work on eliminating or reducing the presence of highly palatable, calorie dense foods in the home. Will complete provided nutritional and psychosocial assessment questionnaire before the next appointment. Will be scheduled for indirect calorimetry to determine resting energy expenditure in a fasting state.  This will allow [place] to create a reduced calorie, high-protein meal plan to promote loss of fat mass while preserving muscle mass.  Obesity Education Performed Today:  She was weighed on the bioimpedance scale and results were discussed and documented in the synopsis.  We discussed obesity as a disease and the importance of a more detailed evaluation of all the factors contributing to the disease.  We discussed the importance of long term lifestyle changes which include nutrition, exercise and behavioral modifications as well as the importance of customizing this to her specific health and social needs.  We discussed the benefits of reaching a healthier weight to alleviate the symptoms of existing conditions and reduce the risks of the biomechanical, metabolic and psychological effects of obesity.  Monica Park appears to be in the action stage of change and states they are ready to start intensive lifestyle modifications and behavioral modifications.  30 minutes was spent today on this visit including the above counseling, pre-visit chart review, and post-visit documentation.  Reviewed by clinician on day of visit: allergies, medications, problem list, medical history, surgical history, family history, social history, and previous encounter notes pertinent to obesity diagnosis.     Monica Rud Maximino Cozzolino FNP-C

## 2023-03-06 ENCOUNTER — Ambulatory Visit: Payer: Medicare Other

## 2023-03-12 ENCOUNTER — Other Ambulatory Visit: Payer: Self-pay | Admitting: Cardiology

## 2023-03-12 DIAGNOSIS — I5032 Chronic diastolic (congestive) heart failure: Secondary | ICD-10-CM

## 2023-03-13 ENCOUNTER — Other Ambulatory Visit: Payer: Self-pay

## 2023-03-13 DIAGNOSIS — I5032 Chronic diastolic (congestive) heart failure: Secondary | ICD-10-CM

## 2023-03-13 MED ORDER — EMPAGLIFLOZIN 10 MG PO TABS: 10.0000 mg | ORAL_TABLET | Freq: Every day | ORAL | 2 refills | Status: DC

## 2023-03-17 ENCOUNTER — Ambulatory Visit: Payer: Medicare Other | Admitting: Cardiology

## 2023-03-19 ENCOUNTER — Ambulatory Visit (INDEPENDENT_AMBULATORY_CARE_PROVIDER_SITE_OTHER): Payer: 59 | Admitting: Bariatrics

## 2023-03-19 ENCOUNTER — Encounter: Payer: Self-pay | Admitting: Bariatrics

## 2023-03-19 VITALS — BP 166/76 | HR 54 | Temp 98.0°F | Ht 65.0 in | Wt 211.0 lb

## 2023-03-19 DIAGNOSIS — R7303 Prediabetes: Secondary | ICD-10-CM | POA: Insufficient documentation

## 2023-03-19 DIAGNOSIS — E559 Vitamin D deficiency, unspecified: Secondary | ICD-10-CM | POA: Insufficient documentation

## 2023-03-19 DIAGNOSIS — E538 Deficiency of other specified B group vitamins: Secondary | ICD-10-CM | POA: Diagnosis not present

## 2023-03-19 DIAGNOSIS — I10 Essential (primary) hypertension: Secondary | ICD-10-CM | POA: Diagnosis not present

## 2023-03-19 DIAGNOSIS — Z87891 Personal history of nicotine dependence: Secondary | ICD-10-CM

## 2023-03-19 DIAGNOSIS — E65 Localized adiposity: Secondary | ICD-10-CM

## 2023-03-19 DIAGNOSIS — Z6835 Body mass index (BMI) 35.0-35.9, adult: Secondary | ICD-10-CM

## 2023-03-19 DIAGNOSIS — R5383 Other fatigue: Secondary | ICD-10-CM

## 2023-03-19 DIAGNOSIS — R7309 Other abnormal glucose: Secondary | ICD-10-CM

## 2023-03-19 DIAGNOSIS — R0602 Shortness of breath: Secondary | ICD-10-CM | POA: Diagnosis not present

## 2023-03-19 DIAGNOSIS — Z1331 Encounter for screening for depression: Secondary | ICD-10-CM

## 2023-03-19 DIAGNOSIS — E669 Obesity, unspecified: Secondary | ICD-10-CM

## 2023-03-20 ENCOUNTER — Encounter (INDEPENDENT_AMBULATORY_CARE_PROVIDER_SITE_OTHER): Payer: Self-pay | Admitting: Bariatrics

## 2023-03-20 LAB — TSH+T4F+T3FREE
Free T4: 1.21 ng/dL (ref 0.82–1.77)
T3, Free: 1.8 pg/mL — ABNORMAL LOW (ref 2.0–4.4)
TSH: 1.03 u[IU]/mL (ref 0.450–4.500)

## 2023-03-20 LAB — HEMOGLOBIN A1C
Est. average glucose Bld gHb Est-mCnc: 123 mg/dL
Hgb A1c MFr Bld: 5.9 % — ABNORMAL HIGH (ref 4.8–5.6)

## 2023-03-20 LAB — VITAMIN B12: Vitamin B-12: 605 pg/mL (ref 232–1245)

## 2023-03-20 LAB — VITAMIN D 25 HYDROXY (VIT D DEFICIENCY, FRACTURES): Vit D, 25-Hydroxy: 59.6 ng/mL (ref 30.0–100.0)

## 2023-03-20 LAB — INSULIN, RANDOM: INSULIN: 18.8 u[IU]/mL (ref 2.6–24.9)

## 2023-03-24 ENCOUNTER — Ambulatory Visit: Payer: 59 | Admitting: Cardiology

## 2023-03-24 ENCOUNTER — Encounter: Payer: Self-pay | Admitting: Cardiology

## 2023-03-24 VITALS — BP 150/73 | HR 70 | Resp 16 | Ht 65.0 in | Wt 214.0 lb

## 2023-03-24 DIAGNOSIS — E782 Mixed hyperlipidemia: Secondary | ICD-10-CM

## 2023-03-24 DIAGNOSIS — I10 Essential (primary) hypertension: Secondary | ICD-10-CM

## 2023-03-24 DIAGNOSIS — I5032 Chronic diastolic (congestive) heart failure: Secondary | ICD-10-CM

## 2023-03-24 DIAGNOSIS — I2510 Atherosclerotic heart disease of native coronary artery without angina pectoris: Secondary | ICD-10-CM

## 2023-03-24 DIAGNOSIS — I739 Peripheral vascular disease, unspecified: Secondary | ICD-10-CM

## 2023-03-24 NOTE — Progress Notes (Unsigned)
Chief Complaint:   OBESITY Monica Park (MR# [id number]) is a 67 y.o. female who presents for evaluation and treatment of obesity and related comorbidities. Current BMI is Body mass index is 35.11 kg/m. Monica Park has been struggling with her weight for many years and has been unsuccessful in either losing weight, maintaining weight loss, or reaching her healthy weight goal.  Monica Park is here for an initial visit.  She saw Everardo Pacific, FNP on 03/05/2023 for information session.  Monica Park is currently in the action stage of change and ready to dedicate time achieving and maintaining a healthier weight. Monica Park is interested in becoming our patient and working on intensive lifestyle modifications including (but not limited to) diet and exercise for weight loss.  Monica Park's habits were reviewed today and are as follows: Her family eats meals together, she thinks her family will eat healthier with her, her desired weight loss is 55 lbs, she started gaining weight after having children, her heaviest weight ever was 285 pounds, she is a picky eater and doesn't like to eat healthier foods, she has significant food cravings issues, she skips meals frequently, she is frequently drinking liquids with calories, she frequently makes poor food choices, she frequently eats larger portions than normal, and she struggles with emotional eating.  Depression Screen Monica Park's Food and Mood (modified PHQ-9) score was 6.  Subjective:   1. Other fatigue Monica Park admits to daytime somnolence and admits to waking up still tired. Patient has a history of symptoms of daytime fatigue, morning fatigue, and morning headache. Monica Park generally gets 8 or 9 hours of sleep per night, and states that she has nightime awakenings. Snoring is present. Apneic episodes are not present. Epworth Sleepiness Score is 8.   2. Shortness of breath Monica Park notes increasing shortness of breath with exercising and seems to be worsening over  time with weight gain. She notes getting out of breath sooner with activity than she used to. This has not gotten worse recently. Monica Park denies shortness of breath at rest or orthopnea.  3. Essential hypertension Patient is taking Cozaar, Lasix.  Patient has not taken her medications today.  4. Visceral obesity Visceral fat rating of 14.  5. B12 deficiency Patient is not taking any vitamins.  6. Vitamin D deficiency Patient is not taking any vitamins.  7. Elevated glucose Patient is not taking any medications.  Assessment/Plan:   1. Other fatigue Monica Park does feel that her weight is causing her energy to be lower than it should be. Fatigue may be related to obesity, depression or many other causes. Labs will be ordered, and in the meanwhile, Asianna will focus on self care including making healthy food choices, increasing physical activity and focusing on stress reduction.  Check IC and labs today.   - TSH+T4F+T3Free  2. Shortness of breath Monica Park does feel that she gets out of breath more easily that she used to when she exercises. Jamea's shortness of breath appears to be obesity related and exercise induced. She has agreed to work on weight loss and gradually increase exercise to treat her exercise induced shortness of breath. Will continue to monitor closely.  - TSH+T4F+T3Free  3. Essential hypertension Continue medications.  4. Visceral obesity Goal of less than 13.  5. B12 deficiency Check labs today.  - Vitamin B12  6. Vitamin D deficiency Check labs today.  - VITAMIN D 25 Hydroxy (Vit-D Deficiency, Fractures)  7. Elevated glucose Check labs today.  - Hemoglobin A1c - Insulin, random  8. Depression screening Monica Park had a positive depression screening. Depression is commonly associated with obesity and often results in emotional eating behaviors. We will monitor this closely and work on CBT to help improve the non-hunger eating patterns. Referral to Psychology  may be required if no improvement is seen as she continues in our clinic.  9. Generalized obesity Class Monica Park 10. BMI 35.0-35.9,adult Monica Park is currently in the action stage of change and her goal is to continue with weight loss efforts. I recommend Monica Park begin the structured treatment plan as follows:  She has agreed to the Category 1 Plan +100 cal. 1.  Meal planning. 2.  Reviewed labs from 01/08/2023, BMP, lipid, glucose. 3.  Will not skip meals.  Exercise goals:  As is.     Behavioral modification strategies: increasing lean protein intake, decreasing simple carbohydrates, increasing vegetables, increasing water intake, decreasing eating out, no skipping meals, meal planning and cooking strategies, keeping healthy foods in the home, and planning for success.  She was informed of the importance of frequent follow-up visits to maximize her success with intensive lifestyle modifications for her multiple health conditions. She was informed we would discuss her lab results at her next visit unless there is a critical issue that needs to be addressed sooner. Monica Park agreed to keep her next visit at the agreed upon time to discuss these results.  Objective:   Blood pressure (!) 166/76, pulse (!) 54, temperature 98 F (36.7 C), height 5\' 5"  (1.651 m), weight 211 lb (95.7 kg), SpO2 98 %. Body mass index is 35.11 kg/m.  EKG: Normal sinus rhythm, rate 60 bpm.  Indirect Calorimeter completed today shows a VO2 of 198 and a REE of 1368.  Her calculated basal metabolic rate is [id number] thus her basal metabolic rate is worse than expected.  General: Cooperative, alert, well developed, in no acute distress. HEENT: Conjunctivae and lids unremarkable. Cardiovascular: Regular rhythm.  Lungs: Normal work of breathing. Neurologic: No focal deficits.   Lab Results  Component Value Date   CREATININE 1.52 (H) 01/08/2023   BUN 23 01/08/2023   NA 144 01/08/2023   K 5.3 (H) 01/08/2023   CL 101  01/08/2023   CO2 24 01/08/2023   Lab Results  Component Value Date   ALT 11 02/13/2022   AST 14 (L) 02/13/2022   ALKPHOS 69 02/13/2022   BILITOT 0.3 02/13/2022   Lab Results  Component Value Date   HGBA1C 5.9 (H) 03/19/2023   Lab Results  Component Value Date   INSULIN 18.8 03/19/2023   Lab Results  Component Value Date   TSH 1.030 03/19/2023   Lab Results  Component Value Date   CHOL 152 01/08/2023   HDL 56 01/08/2023   LDLCALC 76 01/08/2023   TRIG 109 01/08/2023   CHOLHDL 2.7 01/08/2023   Lab Results  Component Value Date   WBC 8.2 02/18/2022   HGB 9.3 (L) 02/18/2022   HCT 27.7 (L) 02/18/2022   MCV 89 02/18/2022   PLT 308 02/18/2022   No results found for: "IRON", "TIBC", "FERRITIN"  Attestation Statements:   Reviewed by clinician on day of visit: allergies, medications, problem list, medical history, surgical history, family history, social history, and previous encounter notes.  Delfina Redwood, am acting as Location manager for [employer], DO.  I have reviewed the above documentation for accuracy and completeness, and I agree with the above. Jearld Lesch, DO

## 2023-03-24 NOTE — Progress Notes (Signed)
Patient referred by Dr. Jilda Panda for coronary artery disease, carotid bruit  Subjective:   Monica Park, female    DOB: 1956-12-06, 67 y.o.   MRN: [id number]    Chief Complaint  Patient presents with   PAD   Follow-up    6 week     HPI  67 y.o. Caucasian female with hypertension, hyperlipidemia, hypothyroidism, multiple sclerosis, CAD, PAD  Claudication symptoms have improved.  She has regular follow-up with podiatry.  No nonhealing ulcers/wounds.  No resting pain.  Blood pressure elevated today, but reportedly better at home.  Patient was taking Pletal, but stopped it as she was having leg pain, she thought it was a side effect of the medication.  Currently, she is not taking Pletal.   Current Outpatient Medications:    acetaminophen (TYLENOL) 650 MG CR tablet, Take 1,300 mg by mouth daily., Disp: , Rfl:    albuterol (VENTOLIN HFA) 108 (90 Base) MCG/ACT inhaler, Inhale 2 puffs into the lungs every 4 (four) hours as needed for wheezing or shortness of breath., Disp: , Rfl:    cetirizine (ZYRTEC) 10 MG tablet, Take 10 mg by mouth daily., Disp: , Rfl:    cilostazol (PLETAL) 50 MG tablet, Take 1 tablet (50 mg total) by mouth 2 (two) times daily., Disp: 60 tablet, Rfl: 3   empagliflozin (JARDIANCE) 10 MG TABS tablet, Take 1 tablet (10 mg total) by mouth daily before breakfast., Disp: 90 tablet, Rfl: 2   Fluticasone-Umeclidin-Vilant (TRELEGY ELLIPTA) 100-62.5-25 MCG/ACT AEPB, Inhale into the lungs., Disp: , Rfl:    furosemide (LASIX) 40 MG tablet, TAKE 1 TABLET BY MOUTH DAILY, Disp: 90 tablet, Rfl: 0   gabapentin (NEURONTIN) 600 MG tablet, TAKE ONE TABLET BY MOUTH THREE TIMES A DAY, Disp: 90 tablet, Rfl: 1   levothyroxine (SYNTHROID) 25 MCG tablet, Take 25 mcg by mouth daily., Disp: , Rfl:    losartan (COZAAR) 50 MG tablet, Take 50 mg by mouth daily., Disp: , Rfl:    montelukast (SINGULAIR) 10 MG tablet, Take 10 mg by mouth daily., Disp: , Rfl:     NEOMYCIN-POLYMYXIN-HYDROCORTISONE (CORTISPORIN) 1 % SOLN OTIC solution, Apply 1-2 drops to toe BID after soaking, Disp: 10 mL, Rfl: 1   ondansetron (ZOFRAN-ODT) 4 MG disintegrating tablet, Take 1 tablet (4 mg total) by mouth every 8 (eight) hours as needed for nausea or vomiting., Disp: 20 tablet, Rfl: 0   pantoprazole (PROTONIX) 40 MG tablet, Take 40 mg by mouth daily., Disp: , Rfl:     Cardiovascular and other pertinent studies:  EKG 01/08/2023: Sinus rhythm 60 bpm Normal EKG  Lower Extremity Arterial Duplex 11/20/2022:  Markedly elevated right EIA and CFA velocity of >400 cm/sec suggests  hemodynamically significant >50% stenosis.  The right lower extremity waveforms demonstrate monophasic flow pattern  through out the right lower extremity suggesting  proximal significant stenosis.  Left proximal superficial femoral artery to mid superficial femoral artery  patent stent noted.  Ankle pressures not obtained due to patient having pain in ankles and  patient request.  Compared to the study done on 05/25/2021, no change in right lower extremity  stenosis. Left SFA near occlusion is now patent with stent.  Prior ABI on the right was 0.80, left was 0.64   Echocardiogram 10/03/2022:  Left ventricle cavity is normal in size and wall thickness. Normal global  wall motion. Normal LV systolic function with EF 62%. Doppler evidence of  grade I (impaired) diastolic dysfunction, normal LAP.  No significant  valvular abnormality.  No evidence of pulmonary hypertension.  Following findings noted on 02/07/2021 not appreciated on this study: Mild  left atrial dilatation, mild MR, dilated IVC.   Mobile cardiac telemetry 13 days 08/20/2022 - 09/03/2022: Dominant rhythm: Sinus. HR 45-139 bpm. Avg HR 73 bpm, in sinus rhythm. 15 episodes of SVT, fastest at 211 bpm for 17 beats, longest for 9.3 secs at 138 bpm. <1% isolated SVE,  couplet/triplets. 0 episodes of VT. <1% isolated VE, no  couplet/triplets. No atrial fibrillation/atrial flutter/VT/high grade AV block, sinus pause >3sec noted. 3 patient triggered events, did not correlate with SVT< correlated with sinus rhythm/artifact.   Abdominal aortogram with lower extremity intervention 02/12/2022: Patent b/l aortoiliac arteries with mild disease Flush occluded Lt SFA CTO, reconstituting in distal SFA through collaterals from profunda Two vessel below the knee run-off with occluded ATA   Complex peripheral intervention: Directional atherectomy Lt SFA     PTA and overlapping stenting Lt SFA     Eluvia drug eluting stents 6.0X150 cm, 6.0X150 cm, 6.0X60 mm     Post dilatation with 6.0 mm balloons up to 24 atm  Renal artery duplex 01/08/2022: 1. Technically challenging examination secondary to patient body habitus. CT or MR arteriogram may be more sensitive for the detection of renal artery stenosis. 2. No convincing evidence of hemodynamically significant renal artery stenosis by duplex ultrasound. 3. Renal size discrepancy. The left kidney is smaller than the right.  Carotid artery duplex 10/04/2021:  Duplex suggests stenosis in the right internal carotid artery (16-49%).  Duplex suggests stenosis in the right external carotid artery (<50%).  Heterogeneous plaque noted.  Duplex suggests stenosis in the left internal carotid artery (50-69%).  Duplex suggests stenosis in the left external carotid artery (<50%).  Homogeneous plaque noted.  Antegrade right vertebral artery flow. Antegrade left vertebral artery flow.  Compared to the study done on 03/27/2021, right ICA stenosis severity has reduced from 50 to 69% to the present less than 50%. Follow up in six months is appropriate if clinically indicated.   Recent labs: 01/08/2023: Glucose 92, BUN/Cr 23/1.52. EGFR 37. Na/K 144/5.3. Rest of the CMP normal H/H 9/27. MCV 89. Platelets 308 HbA1C 5.9% Chol 152, TG 109, HDL 56, LDL 76 TSH 1.0 normal  08/09/2022: Glucose 84,  BUN/Cr 23/1.48. EGFR 39. Na/K 144/4.8.  BNP 170 Chol 215, TG 89, HDL 56, LDL 143   Review of Systems  Cardiovascular:  Positive for claudication (improving). Negative for chest pain, dyspnea on exertion, leg swelling, palpitations and syncope.  Musculoskeletal:        B/l feet pain - improving       There were no vitals filed for this visit.  No data found.    There is no height or weight on file to calculate BMI. There were no vitals filed for this visit.    Objective:   Physical Exam Vitals and nursing note reviewed.  Constitutional:      General: She is not in acute distress. Neck:     Vascular: No JVD.  Cardiovascular:     Rate and Rhythm: Normal rate and regular rhythm.     Pulses:          Femoral pulses are 1+ on the right side and 1+ on the left side.      Popliteal pulses are 1+ on the right side and 1+ on the left side.       Dorsalis pedis pulses are 1+ on the right side and 1+ on  the left side.       Posterior tibial pulses are 0 on the right side and 1+ on the left side.     Heart sounds: Normal heart sounds. No murmur heard. Pulmonary:     Effort: Pulmonary effort is normal.     Breath sounds: Normal breath sounds. No wheezing or rales.  Musculoskeletal:     Right lower leg: No edema.     Left lower leg: No edema.  Feet:     Right foot:     Skin integrity: Dry skin present. No ulcer.     Toenail Condition: Right toenails are abnormally thick.     Left foot:     Skin integrity: Dry skin present. No ulcer.     Toenail Condition: Left toenails are abnormally thick.       ICD-10-CM   1. PAD (peripheral artery disease)  A999333 Basic metabolic panel    Lipid panel    Lipid panel    Basic metabolic panel    2. Chronic heart failure with preserved ejection fraction  I50.32     3. Coronary artery disease involving native coronary artery of native heart without angina pectoris  I25.10     4. Mixed hyperlipidemia  E78.2     5. Essential hypertension   I10         Assessment & Recommendations:   67 y.o. Caucasian female with hypertension, hyperlipidemia, hypothyroidism, multiple sclerosis, CAD, PAD, syncope, HFpEF  PAD: S/p intervention to left SFA with atherectomy and overlapping drug-eluting stents (01/2022). Leg pain has improved.  Currently, Rutherford class II claudication.  No resting leg pain, critical limb ischemia. Continue aspirin 81 mg daily.  Encouraged to take Pletal 50 mg twice daily. Encourage walking. If worsening symptoms of claudication, or critical limb ischemia, will then recommend lower extremity angiogram, abdominal aortogram, and possible intervention. She will need CO2 given her renal dysfunction.  HFpEF: Euvolemic today.  Continue Jardiance.  Syncope: No recurrence. No significant arrhythmia on monitor.  CAD: Seen on CT chest 07/2020 No angina symptoms at this time.  Continue medical management. See below regarding lipid management.  Mixed hyperlipidemia: Continue Leqvio.  Hypertension: I pressure reviewed today.  Suspect complaint of hypertension. Arranged for remote patient monitoring through pur pharmacist Haynes Dage.  Carotid artery disease: Asymptomatic, moderate, bilateral  F/u in 3 months    Nigel Mormon, MD Pager: [phone] Office: [phone]

## 2023-03-25 ENCOUNTER — Encounter: Payer: Self-pay | Admitting: Bariatrics

## 2023-04-01 ENCOUNTER — Ambulatory Visit: Payer: 59 | Admitting: Podiatry

## 2023-04-02 ENCOUNTER — Encounter: Payer: Self-pay | Admitting: Bariatrics

## 2023-04-02 ENCOUNTER — Ambulatory Visit (INDEPENDENT_AMBULATORY_CARE_PROVIDER_SITE_OTHER): Payer: 59 | Admitting: Bariatrics

## 2023-04-02 VITALS — BP 150/70 | HR 60 | Temp 97.5°F | Ht 65.0 in | Wt 210.0 lb

## 2023-04-02 DIAGNOSIS — Z6835 Body mass index (BMI) 35.0-35.9, adult: Secondary | ICD-10-CM

## 2023-04-02 DIAGNOSIS — R7303 Prediabetes: Secondary | ICD-10-CM | POA: Diagnosis not present

## 2023-04-02 DIAGNOSIS — E669 Obesity, unspecified: Secondary | ICD-10-CM | POA: Diagnosis not present

## 2023-04-02 DIAGNOSIS — I10 Essential (primary) hypertension: Secondary | ICD-10-CM | POA: Diagnosis not present

## 2023-04-02 DIAGNOSIS — E88819 Insulin resistance, unspecified: Secondary | ICD-10-CM

## 2023-04-02 NOTE — Progress Notes (Signed)
Chief Complaint:   OBESITY Monica Park is here to discuss her progress with her obesity treatment plan along with follow-up of her obesity related diagnoses. Monica Park is on the Category 1 Plan + 100 calories and states she is following her eating plan approximately 90% of the time. Monica Park states she is doing 0 minutes 0 times per week.  Today's visit was #: 2 Starting weight: 211 lbs Starting date: 03/19/2023 Today's weight: 210 lbs Today's date: 04/02/2023 Total lbs lost to date: 1 Total lbs lost since last in-office visit: 1  Interim History: Monica Park is here for her first follow-up.  She is down 1 pound since her last visit.  She has had severe sinus congestion.  She has struggled with sugar (pop tarts and ice cream).  She did switch her diuretics.  Subjective:   1. Pre-diabetes Monica Park's recent A1c was 5.9 and insulin level 18.8.  2. Insulin resistance Monica Park's recent insulin level was 18.8.  3. Essential hypertension Monica Park's blood pressure is elevated today as she was rushing into the clinic.  She states she took her medications.  Assessment/Plan:   1. Pre-diabetes Monica Park will continue working on minimizing all carbohydrates (sweets and starches).  She will work on increasing her protein intake.  Handouts on insulin resistance and prediabetes were given to the patient today.  2. Insulin resistance Monica Park's goal is 5 or below (fasting insulin).  She will continue to work on minimizing carbohydrates and processed foods.  3. Essential hypertension Monica Park is to take her blood pressure at home for her PCP.    4. Generalized obesity  5. BMI 35.0-35.9,adult Monica Park is currently in the action stage of change. As such, her goal is to continue with weight loss efforts. She has agreed to the Category 1 Plan + 100 calories.   Meal planning was discussed. Lunch and dinner options were given. Review labs with the patient from 03/19/2023, vitamin D, B12, A1c, insulin, and thyroid  panel.  Behavioral modification strategies: increasing lean protein intake, decreasing simple carbohydrates, increasing vegetables, increasing water intake, decreasing eating out, no skipping meals, meal planning and cooking strategies, keeping healthy foods in the home, and planning for success.  Monica Park has agreed to follow-up with our clinic in 2 weeks. She was informed of the importance of frequent follow-up visits to maximize her success with intensive lifestyle modifications for her multiple health conditions.   Objective:   Blood pressure (!) 150/70, pulse 60, temperature (!) 97.5 F (36.4 C), height 5\' 5"  (1.651 m), weight 210 lb (95.3 kg), SpO2 99 %. Body mass index is 34.95 kg/m.  General: Cooperative, alert, well developed, in no acute distress. HEENT: Conjunctivae and lids unremarkable. Cardiovascular: Regular rhythm.  Lungs: Normal work of breathing. Neurologic: No focal deficits.   Lab Results  Component Value Date   CREATININE 1.52 (H) 01/08/2023   BUN 23 01/08/2023   NA 144 01/08/2023   K 5.3 (H) 01/08/2023   CL 101 01/08/2023   CO2 24 01/08/2023   Lab Results  Component Value Date   ALT 11 02/13/2022   AST 14 (L) 02/13/2022   ALKPHOS 69 02/13/2022   BILITOT 0.3 02/13/2022   Lab Results  Component Value Date   HGBA1C 5.9 (H) 03/19/2023   Lab Results  Component Value Date   INSULIN 18.8 03/19/2023   Lab Results  Component Value Date   TSH 1.030 03/19/2023   Lab Results  Component Value Date   CHOL 152 01/08/2023   HDL 56 01/08/2023  LDLCALC 76 01/08/2023   TRIG 109 01/08/2023   CHOLHDL 2.7 01/08/2023   Lab Results  Component Value Date   VD25OH 59.6 03/19/2023   Lab Results  Component Value Date   WBC 8.2 02/18/2022   HGB 9.3 (L) 02/18/2022   HCT 27.7 (L) 02/18/2022   MCV 89 02/18/2022   PLT 308 02/18/2022   No results found for: "IRON", "TIBC", "FERRITIN"  Attestation Statements:   Reviewed by clinician on day of visit: allergies,  medications, problem list, medical history, surgical history, family history, social history, and previous encounter notes.  Monica McburneyI, Monica Park, am acting as transcriptionist for [employer] Dewon Mendizabal, DO  I have reviewed the above documentation for accuracy and completeness, and I agree with the above. Monica Capra-  Jannifer Fischler, DO

## 2023-04-04 ENCOUNTER — Telehealth: Payer: Self-pay

## 2023-04-04 NOTE — Telephone Encounter (Signed)
Initial follow up after starting home blood pressure monitoring with RPM. Patient BP fluctuates at home. Patient states she has been sick and feels that is why her BP as been elevated.   04/03/23 12:00 PM 91 / 60 61     04/02/23 12:57 PM 166 / 104 63     04/01/23 3:27 PM 148 / 81 61     03/31/23 9:07 AM 128 / 82 55     03/28/23 11:38 AM 158 / 93 61     03/25/23 8:25 AM 157 / 92 63     03/24/23 2:01 PM 154 / 88 68

## 2023-04-08 ENCOUNTER — Encounter: Payer: Self-pay | Admitting: Bariatrics

## 2023-04-16 NOTE — Telephone Encounter (Signed)
Patient BP overall is controlled. She has episodes of low BP sometimes. Denies dizziness or lightheadedness on days with low or low-normal BP. She did fall a couple of days ago; turned around quickly and fell. She reports to drink about a gallon of water daily. Patient takes the Jardiance daily, furosemide mostly M-F but not on weekends, and takes losartan  when she feels good and halves the tablet to  when she doesn't feel good.  30-d blood pressure average 132/81 mmHg 30-d heart rate average 65 bpm  Date Sys (mmHg)  Dia (mmHg) HR     04/15/23 2:21 PM 126 / 83 66     04/14/23 1:32 PM 85 / 51 78     04/13/23 12:11 PM 162 / 104 68     04/11/23 7:26 AM 120 / 70 74     04/10/23 8:09 AM 126 / 78 71     04/07/23 8:17 AM 122 / 81 62     04/05/23 7:11 PM 132 / 84 61     04/04/23 12:59 PM 104 / 63 67     04/03/23 12:00 PM 91 / 60 61     04/02/23 12:57 PM 166 / 104 63     04/01/23 3:27 PM 148 / 81 61     03/31/23 9:07 AM 128 / 82 55   03/28/23 11:38 AM 158 / 93 61     03/25/23 8:25 AM 157 / 92 63     03/24/23 2:01 PM 154 / 88 68

## 2023-04-17 NOTE — Telephone Encounter (Signed)
Can change lasix to only as needed, rahter than daily.  Thanks MJP

## 2023-04-18 MED ORDER — FUROSEMIDE 40 MG PO TABS: 40.0000 mg | ORAL_TABLET | ORAL | 0 refills | Status: DC | PRN

## 2023-04-18 NOTE — Addendum Note (Signed)
Addended by: Byrd Hesselbach on: 04/18/2023 11:07 AM   Modules accepted: Orders

## 2023-04-18 NOTE — Telephone Encounter (Signed)
Spoke with patient regarding change.

## 2023-04-21 ENCOUNTER — Ambulatory Visit (INDEPENDENT_AMBULATORY_CARE_PROVIDER_SITE_OTHER): Payer: 59 | Admitting: Bariatrics

## 2023-04-21 ENCOUNTER — Encounter: Payer: Self-pay | Admitting: Bariatrics

## 2023-04-21 VITALS — BP 107/66 | HR 65 | Temp 98.7°F | Ht 65.0 in | Wt 209.0 lb

## 2023-04-21 DIAGNOSIS — Z6835 Body mass index (BMI) 35.0-35.9, adult: Secondary | ICD-10-CM

## 2023-04-21 DIAGNOSIS — R7303 Prediabetes: Secondary | ICD-10-CM | POA: Diagnosis not present

## 2023-04-21 DIAGNOSIS — E669 Obesity, unspecified: Secondary | ICD-10-CM | POA: Diagnosis not present

## 2023-04-22 NOTE — Progress Notes (Signed)
Chief Complaint:   OBESITY Monica Park is here to discuss her progress with her obesity treatment plan along with follow-up of her obesity related diagnoses. Monica Park is on the Category 1 Plan + 100 calories and states she is following her eating plan approximately (unknown)% of the time. Monica Park states she is doing 15 sit-ups for 5 minutes 7 times per week.  Today's visit was #: 3 Starting weight: 211 lbs Starting date: 03/19/2023 Today's weight: 209 lbs Today's date: 04/21/2023 Total lbs lost to date: 3 Total lbs lost since last in-office visit: 1  Interim History: Monica Park is down 1 lb since her visit. She states she is getting on adequate protein and water. She took a fall and has decreased walking.   Subjective:   1. Pre-diabetes Monica Park is not on specific medications except for Jardiance.   Assessment/Plan:   1. Pre-diabetes Monica Park will continue her medications, and keep her water and protein intake high, and decrease carbohydrates. 1100-1200 calories and 80 grams of protein. Increase fiber (chic seeds). Fiber handout was provided.   2. Generalized obesity  3. Obesity, starting BMI 35.11 Monica Park is currently in the action stage of change. As such, her goal is to continue with weight loss efforts. She has agreed to the Category 1 Plan + 100 calories, or keeping a food journal and adhering to recommended goals of 1100-1200 calories and 80 grams of protein, or practicing portion control and making smarter food choices, such as increasing vegetables and decreasing simple carbohydrates.   Meal planning was discussed. She will adhere closely to her calories and protein continue vegetables. Keep water intake high.   Exercise goals: As is.   Behavioral modification strategies: increasing lean protein intake, decreasing simple carbohydrates, increasing vegetables, increasing water intake, decreasing eating out, no skipping meals, meal planning and cooking strategies, keeping healthy foods in  the home, and planning for success.  Monica Park has agreed to follow-up with our clinic in 2 weeks. She was informed of the importance of frequent follow-up visits to maximize her success with intensive lifestyle modifications for her multiple health conditions.   Objective:   Blood pressure 107/66, pulse 65, temperature 98.7 F (37.1 C), height 5\' 5"  (1.651 m), weight 209 lb (94.8 kg), SpO2 97 %. Body mass index is 34.78 kg/m.  General: Cooperative, alert, well developed, in no acute distress. HEENT: Conjunctivae and lids unremarkable. Cardiovascular: Regular rhythm.  Lungs: Normal work of breathing. Neurologic: No focal deficits.   Lab Results  Component Value Date   CREATININE 1.52 (H) 01/08/2023   BUN 23 01/08/2023   NA 144 01/08/2023   K 5.3 (H) 01/08/2023   CL 101 01/08/2023   CO2 24 01/08/2023   Lab Results  Component Value Date   ALT 11 02/13/2022   AST 14 (L) 02/13/2022   ALKPHOS 69 02/13/2022   BILITOT 0.3 02/13/2022   Lab Results  Component Value Date   HGBA1C 5.9 (H) 03/19/2023   Lab Results  Component Value Date   INSULIN 18.8 03/19/2023   Lab Results  Component Value Date   TSH 1.030 03/19/2023   Lab Results  Component Value Date   CHOL 152 01/08/2023   HDL 56 01/08/2023   LDLCALC 76 01/08/2023   TRIG 109 01/08/2023   CHOLHDL 2.7 01/08/2023   Lab Results  Component Value Date   VD25OH 59.6 03/19/2023   Lab Results  Component Value Date   WBC 8.2 02/18/2022   HGB 9.3 (L) 02/18/2022   HCT 27.7 (  L) 02/18/2022   MCV 89 02/18/2022   PLT 308 02/18/2022   No results found for: "IRON", "TIBC", "FERRITIN"  Attestation Statements:   Reviewed by clinician on day of visit: allergies, medications, problem list, medical history, surgical history, family history, social history, and previous encounter notes.   Monica Park, am acting as Energy manager for [employer], DO.  I have reviewed the above documentation for accuracy and completeness,  and I agree with the above. Monica Capra, DO

## 2023-04-24 ENCOUNTER — Ambulatory Visit: Payer: 59 | Admitting: Podiatry

## 2023-05-04 ENCOUNTER — Other Ambulatory Visit: Payer: Self-pay | Admitting: Cardiology

## 2023-05-07 ENCOUNTER — Ambulatory Visit: Payer: 59 | Admitting: Nurse Practitioner

## 2023-05-08 ENCOUNTER — Ambulatory Visit: Payer: 59 | Admitting: Nurse Practitioner

## 2023-05-21 ENCOUNTER — Ambulatory Visit: Payer: 59 | Admitting: Bariatrics

## 2023-06-06 ENCOUNTER — Ambulatory Visit: Payer: Medicare Other

## 2023-06-12 ENCOUNTER — Ambulatory Visit (INDEPENDENT_AMBULATORY_CARE_PROVIDER_SITE_OTHER): Payer: 59

## 2023-06-12 VITALS — BP 145/80 | HR 55 | Temp 98.3°F | Resp 18 | Ht 65.0 in | Wt 211.0 lb

## 2023-06-12 DIAGNOSIS — E782 Mixed hyperlipidemia: Secondary | ICD-10-CM

## 2023-06-12 MED ORDER — INCLISIRAN SODIUM 284 MG/1.5ML ~~LOC~~ SOSY
284.0000 mg | PREFILLED_SYRINGE | Freq: Once | SUBCUTANEOUS | Status: AC
  Administered 2023-06-12: 284 mg via SUBCUTANEOUS
  Filled 2023-06-12: qty 1.5

## 2023-06-12 NOTE — Progress Notes (Signed)
Diagnosis: Hyperlipidemia  Provider:  Mannam, Praveen MD  Procedure: Injection  Leqvio (inclisiran), Dose: 284 mg, Site: subcutaneous, Number of injections: 1  Post Care:  n/a  Discharge: Condition: Good, Destination: Home . AVS Provided  Performed by:  Manroop Jakubowicz E Trellis Guirguis, LPN       

## 2023-06-23 ENCOUNTER — Ambulatory Visit: Payer: 59 | Admitting: Cardiology

## 2023-06-25 ENCOUNTER — Ambulatory Visit: Payer: 59 | Admitting: Cardiology

## 2023-06-28 LAB — BASIC METABOLIC PANEL
BUN/Creatinine Ratio: 18 (ref 12–28)
BUN: 28 mg/dL — ABNORMAL HIGH (ref 8–27)
CO2: 21 mmol/L (ref 20–29)
Calcium: 9.1 mg/dL (ref 8.7–10.3)
Chloride: 106 mmol/L (ref 96–106)
Creatinine, Ser: 1.53 mg/dL — ABNORMAL HIGH (ref 0.57–1.00)
Glucose: 88 mg/dL (ref 70–99)
Potassium: 4.4 mmol/L (ref 3.5–5.2)
Sodium: 140 mmol/L (ref 134–144)
eGFR: 37 mL/min/{1.73_m2} — ABNORMAL LOW (ref >59)

## 2023-06-28 LAB — LIPID PANEL
Chol/HDL Ratio: 2.9 ratio (ref 0.0–4.4)
Cholesterol, Total: 153 mg/dL (ref 100–199)
HDL: 52 mg/dL (ref >39)
LDL Chol Calc (NIH): 83 mg/dL (ref 0–99)
Triglycerides: 98 mg/dL (ref 0–149)
VLDL Cholesterol Cal: 18 mg/dL (ref 5–40)

## 2023-07-01 NOTE — Progress Notes (Unsigned)
Patient referred by Dr. Ralene Ok for coronary artery disease, carotid bruit  Subjective:   [place], female    DOB: May 24, 1956, 67 y.o.   MRN: [id number]    No chief complaint on file.    HPI  67 y.o. Caucasian female with hypertension, hyperlipidemia, hypothyroidism, multiple sclerosis, CAD, PAD  Claudication symptoms have improved.  She has regular follow-up with podiatry.  No nonhealing ulcers/wounds.  No resting pain.  Blood pressure elevated today, but reportedly better at home.  Patient was taking Pletal, but stopped it as she was having leg pain, she thought it was a side effect of the medication.  Currently, she is not taking Pletal.   Current Outpatient Medications:    acetaminophen (TYLENOL) 650 MG CR tablet, Take 1,300 mg by mouth daily., Disp: , Rfl:    albuterol (VENTOLIN HFA) 108 (90 Base) MCG/ACT inhaler, Inhale 2 puffs into the lungs every 4 (four) hours as needed for wheezing or shortness of breath., Disp: , Rfl:    cetirizine (ZYRTEC) 10 MG tablet, Take 10 mg by mouth daily., Disp: , Rfl:    empagliflozin (JARDIANCE) 10 MG TABS tablet, Take 1 tablet (10 mg total) by mouth daily before breakfast., Disp: 90 tablet, Rfl: 2   Fluticasone-Umeclidin-Vilant (TRELEGY ELLIPTA) 100-62.5-25 MCG/ACT AEPB, Inhale into the lungs., Disp: , Rfl:    furosemide (LASIX) 40 MG tablet, TAKE 1 TABLET BY MOUTH DAILY, Disp: 90 tablet, Rfl: 0   gabapentin (NEURONTIN) 600 MG tablet, TAKE ONE TABLET BY MOUTH THREE TIMES A DAY, Disp: 90 tablet, Rfl: 1   levothyroxine (SYNTHROID) 25 MCG tablet, Take 25 mcg by mouth daily., Disp: , Rfl:    losartan (COZAAR) 50 MG tablet, Take 50 mg by mouth daily., Disp: , Rfl:    montelukast (SINGULAIR) 10 MG tablet, Take 10 mg by mouth daily., Disp: , Rfl:    NEOMYCIN-POLYMYXIN-HYDROCORTISONE (CORTISPORIN) 1 % SOLN OTIC solution, Apply 1-2 drops to toe BID after soaking, Disp: 10 mL, Rfl: 1   ondansetron (ZOFRAN-ODT) 4 MG disintegrating tablet,  Take 1 tablet (4 mg total) by mouth every 8 (eight) hours as needed for nausea or vomiting., Disp: 20 tablet, Rfl: 0   pantoprazole (PROTONIX) 40 MG tablet, Take 40 mg by mouth daily., Disp: , Rfl:     Cardiovascular and other pertinent studies:  EKG 01/08/2023: Sinus rhythm 60 bpm Normal EKG  Lower Extremity Arterial Duplex 11/20/2022:  Markedly elevated right EIA and CFA velocity of >400 cm/sec suggests  hemodynamically significant >50% stenosis.  The right lower extremity waveforms demonstrate monophasic flow pattern  through out the right lower extremity suggesting  proximal significant stenosis.  Left proximal superficial femoral artery to mid superficial femoral artery  patent stent noted.  Ankle pressures not obtained due to patient having pain in ankles and  patient request.  Compared to the study done on 05/25/2021, no change in right lower extremity  stenosis. Left SFA near occlusion is now patent with stent.  Prior ABI on the right was 0.80, left was 0.64   Echocardiogram 10/03/2022:  Left ventricle cavity is normal in size and wall thickness. Normal global  wall motion. Normal LV systolic function with EF 62%. Doppler evidence of  grade I (impaired) diastolic dysfunction, normal LAP.  No significant valvular abnormality.  No evidence of pulmonary hypertension.  Following findings noted on 02/07/2021 not appreciated on this study: Mild  left atrial dilatation, mild MR, dilated IVC.   Mobile cardiac telemetry 13 days 08/20/2022 -  09/03/2022: Dominant rhythm: Sinus. HR 45-139 bpm. Avg HR 73 bpm, in sinus rhythm. 15 episodes of SVT, fastest at 211 bpm for 17 beats, longest for 9.3 secs at 138 bpm. <1% isolated SVE,  couplet/triplets. 0 episodes of VT. <1% isolated VE, no couplet/triplets. No atrial fibrillation/atrial flutter/VT/high grade AV block, sinus pause >3sec noted. 3 patient triggered events, did not correlate with SVT< correlated with sinus rhythm/artifact.    Abdominal aortogram with lower extremity intervention 02/12/2022: Patent b/l aortoiliac arteries with mild disease Flush occluded Lt SFA CTO, reconstituting in distal SFA through collaterals from profunda Two vessel below the knee run-off with occluded ATA   Complex peripheral intervention: Directional atherectomy Lt SFA     PTA and overlapping stenting Lt SFA     Eluvia drug eluting stents 6.0X150 cm, 6.0X150 cm, 6.0X60 mm     Post dilatation with 6.0 mm balloons up to 24 atm  Renal artery duplex 01/08/2022: 1. Technically challenging examination secondary to patient body habitus. CT or MR arteriogram may be more sensitive for the detection of renal artery stenosis. 2. No convincing evidence of hemodynamically significant renal artery stenosis by duplex ultrasound. 3. Renal size discrepancy. The left kidney is smaller than the right.  Carotid artery duplex 10/04/2021:  Duplex suggests stenosis in the right internal carotid artery (16-49%).  Duplex suggests stenosis in the right external carotid artery (<50%).  Heterogeneous plaque noted.  Duplex suggests stenosis in the left internal carotid artery (50-69%).  Duplex suggests stenosis in the left external carotid artery (<50%).  Homogeneous plaque noted.  Antegrade right vertebral artery flow. Antegrade left vertebral artery flow.  Compared to the study done on 03/27/2021, right ICA stenosis severity has reduced from 50 to 69% to the present less than 50%. Follow up in six months is appropriate if clinically indicated.   Recent labs: 06/27/2023: Glucose 88, BUN/Cr 28/1.53. EGFR 37. Na/K 140/4.4.  H/H 9/27. MCV 89. Platelets 308 Chol 153, TG 98, HDL 52, LDL 83  01/08/2023: Glucose 92, BUN/Cr 23/1.52. EGFR 37. Na/K 144/5.3. Rest of the CMP normal H/H 9/27. MCV 89. Platelets 308 HbA1C 5.9% Chol 152, TG 109, HDL 56, LDL 76 TSH 1.0 normal    Review of Systems  Cardiovascular:  Positive for claudication (improving). Negative for chest  pain, dyspnea on exertion, leg swelling, palpitations and syncope.  Musculoskeletal:        B/l feet pain - improving       There were no vitals filed for this visit.  No data found.    There is no height or weight on file to calculate BMI. There were no vitals filed for this visit.    Objective:   Physical Exam Vitals and nursing note reviewed.  Constitutional:      General: She is not in acute distress. Neck:     Vascular: No JVD.  Cardiovascular:     Rate and Rhythm: Normal rate and regular rhythm.     Pulses:          Femoral pulses are 1+ on the right side and 1+ on the left side.      Popliteal pulses are 1+ on the right side and 1+ on the left side.       Dorsalis pedis pulses are 1+ on the right side and 1+ on the left side.       Posterior tibial pulses are 0 on the right side and 1+ on the left side.     Heart sounds: Normal heart  sounds. No murmur heard. Pulmonary:     Effort: Pulmonary effort is normal.     Breath sounds: Normal breath sounds. No wheezing or rales.  Musculoskeletal:     Right lower leg: No edema.     Left lower leg: No edema.  Feet:     Right foot:     Skin integrity: Dry skin present. No ulcer.     Toenail Condition: Right toenails are abnormally thick.     Left foot:     Skin integrity: Dry skin present. No ulcer.     Toenail Condition: Left toenails are abnormally thick.     No diagnosis found.     Assessment & Recommendations:   67 y.o. Caucasian female with hypertension, hyperlipidemia, hypothyroidism, multiple sclerosis, CAD, PAD, syncope, HFpEF  PAD: S/p intervention to left SFA with atherectomy and overlapping drug-eluting stents (01/2022). Leg pain has improved.  Currently, Rutherford class II claudication.  No resting leg pain, critical limb ischemia. Continue aspirin 81 mg daily.  Encouraged to take Pletal 50 mg twice daily. Encourage walking. If worsening symptoms of claudication, or critical limb ischemia, will  then recommend lower extremity angiogram, abdominal aortogram, and possible intervention. She will need CO2 given her renal dysfunction.  HFpEF: Euvolemic today.  Continue Jardiance.  Syncope: No recurrence. No significant arrhythmia on monitor.  CAD: Seen on CT chest 07/2020 No angina symptoms at this time.  Continue medical management. See below regarding lipid management.  Mixed hyperlipidemia: Continue Leqvio.  Hypertension: I pressure reviewed today.  Suspect complaint of hypertension. Arranged for remote patient monitoring through pur pharmacist Byrd Hesselbach.  Carotid artery disease: Asymptomatic, moderate, bilateral  F/u in 3 months    Elder Negus, MD Pager: [phone] Office: [phone]

## 2023-07-02 ENCOUNTER — Ambulatory Visit: Payer: 59 | Admitting: Cardiology

## 2023-07-02 ENCOUNTER — Encounter: Payer: Self-pay | Admitting: Cardiology

## 2023-07-02 VITALS — BP 151/76 | HR 76 | Resp 16 | Ht 65.0 in | Wt 209.0 lb

## 2023-07-02 DIAGNOSIS — I739 Peripheral vascular disease, unspecified: Secondary | ICD-10-CM

## 2023-07-02 DIAGNOSIS — E782 Mixed hyperlipidemia: Secondary | ICD-10-CM

## 2023-07-02 DIAGNOSIS — I2510 Atherosclerotic heart disease of native coronary artery without angina pectoris: Secondary | ICD-10-CM

## 2023-07-02 DIAGNOSIS — I10 Essential (primary) hypertension: Secondary | ICD-10-CM

## 2023-07-02 DIAGNOSIS — I5032 Chronic diastolic (congestive) heart failure: Secondary | ICD-10-CM

## 2023-07-03 ENCOUNTER — Other Ambulatory Visit: Payer: Self-pay | Admitting: Cardiology

## 2023-07-03 DIAGNOSIS — I739 Peripheral vascular disease, unspecified: Secondary | ICD-10-CM

## 2023-07-15 ENCOUNTER — Other Ambulatory Visit: Payer: Self-pay | Admitting: Cardiology

## 2023-07-15 DIAGNOSIS — E875 Hyperkalemia: Secondary | ICD-10-CM

## 2023-07-25 ENCOUNTER — Ambulatory Visit: Payer: 59

## 2023-07-25 DIAGNOSIS — I739 Peripheral vascular disease, unspecified: Secondary | ICD-10-CM

## 2023-07-29 ENCOUNTER — Other Ambulatory Visit: Payer: Self-pay | Admitting: Cardiology

## 2023-08-06 ENCOUNTER — Ambulatory Visit: Payer: 59 | Admitting: Cardiology

## 2023-08-07 NOTE — Progress Notes (Addendum)
Patient referred by Dr. Ralene Ok for coronary artery disease, carotid bruit  Subjective:   [place], female    DOB: August 06, 1956, 67 y.o.   MRN: [id number]    Chief Complaint  Patient presents with   PAD   Follow-up     HPI  67 y.o. Caucasian female with hypertension, hyperlipidemia, hypothyroidism, multiple sclerosis, CAD, PAD  Patient is here with her sister.  She has continued to have limiting claudication in spite of optimal medical therapy and regular walking.  While she is able to walk 5000-8000 steps most days, she has some days where she can barely make it back from her driveway.  Hemoglobin has improved.  Blood pressure elevated today, but reportedly much lower at home.    Current Outpatient Medications:    acetaminophen (TYLENOL) 650 MG CR tablet, Take 1,300 mg by mouth daily., Disp: , Rfl:    albuterol (VENTOLIN HFA) 108 (90 Base) MCG/ACT inhaler, Inhale 2 puffs into the lungs every 4 (four) hours as needed for wheezing or shortness of breath., Disp: , Rfl:    cetirizine (ZYRTEC) 10 MG tablet, Take 10 mg by mouth daily., Disp: , Rfl:    empagliflozin (JARDIANCE) 10 MG TABS tablet, Take 1 tablet (10 mg total) by mouth daily before breakfast., Disp: 90 tablet, Rfl: 2   Fluticasone-Umeclidin-Vilant (TRELEGY ELLIPTA) 100-62.5-25 MCG/ACT AEPB, Inhale into the lungs., Disp: , Rfl:    furosemide (LASIX) 40 MG tablet, TAKE 1 TABLET BY MOUTH DAILY, Disp: 90 tablet, Rfl: 0   gabapentin (NEURONTIN) 600 MG tablet, TAKE ONE TABLET BY MOUTH THREE TIMES A DAY, Disp: 90 tablet, Rfl: 1   inclisiran (LEQVIO) 284 MG/1.5ML SOSY injection, Inject 284 mg into the skin once., Disp: , Rfl:    levothyroxine (SYNTHROID) 25 MCG tablet, Take 25 mcg by mouth daily., Disp: , Rfl:    losartan (COZAAR) 50 MG tablet, Take 50 mg by mouth daily., Disp: , Rfl:    montelukast (SINGULAIR) 10 MG tablet, Take 10 mg by mouth daily., Disp: , Rfl:    ondansetron (ZOFRAN-ODT) 4 MG disintegrating  tablet, Take 1 tablet (4 mg total) by mouth every 8 (eight) hours as needed for nausea or vomiting. (Patient not taking: Reported on 07/02/2023), Disp: 20 tablet, Rfl: 0   pantoprazole (PROTONIX) 40 MG tablet, Take 40 mg by mouth daily., Disp: , Rfl:     Cardiovascular and other pertinent studies:  EKG 01/08/2023: Sinus rhythm 60 bpm Normal EKG  Lower Extremity Arterial Duplex 07/25/2023: No hemodynamically significant stenoses are identified in the right lower extremity arterial system, diffuse monophasic waveform pattern with mild heterogeneous plaque throughout the right iliac and CFA and PFA.  Moderate velocity increase at the left profunda femoral artery suggests >50% stenosis. There is a patent stent in the left SFA with mild diffuse neo-intimal hyperplasia and diffuse monophasic waveforms throughout the left lower extremity.  This exam reveals mildly decreased perfusion of the left lower extremity, noted at the dorsalis pedis and post tibial artery level (ABI 0.96).  Right ABI could not be obtained due to patient comfort.  Bilateral severely abnormal monophasic waveform pattern suggests diffuse disease.  Compared to the study done on 11/20/2022, right EIA and CFA velocity to suggest hemodynamically significant stenosis is not noted in the present study.  Bilateral ABI was not obtained previously again due to patient discomfort.    Echocardiogram 10/03/2022:  Left ventricle cavity is normal in size and wall thickness. Normal global  wall motion. Normal  LV systolic function with EF 62%. Doppler evidence of  grade I (impaired) diastolic dysfunction, normal LAP.  No significant valvular abnormality.  No evidence of pulmonary hypertension.  Following findings noted on 02/07/2021 not appreciated on this study: Mild  left atrial dilatation, mild MR, dilated IVC.   Mobile cardiac telemetry 13 days 08/20/2022 - 09/03/2022: Dominant rhythm: Sinus. HR 45-139 bpm. Avg HR 73 bpm, in sinus  rhythm. 15 episodes of SVT, fastest at 211 bpm for 17 beats, longest for 9.3 secs at 138 bpm. <1% isolated SVE,  couplet/triplets. 0 episodes of VT. <1% isolated VE, no couplet/triplets. No atrial fibrillation/atrial flutter/VT/high grade AV block, sinus pause >3sec noted. 3 patient triggered events, did not correlate with SVT< correlated with sinus rhythm/artifact.   Abdominal aortogram with lower extremity intervention 02/12/2022: Patent b/l aortoiliac arteries with mild disease Flush occluded Lt SFA CTO, reconstituting in distal SFA through collaterals from profunda Two vessel below the knee run-off with occluded ATA   Complex peripheral intervention: Directional atherectomy Lt SFA     PTA and overlapping stenting Lt SFA     Eluvia drug eluting stents 6.0X150 cm, 6.0X150 cm, 6.0X60 mm     Post dilatation with 6.0 mm balloons up to 24 atm  Renal artery duplex 01/08/2022: 1. Technically challenging examination secondary to patient body habitus. CT or MR arteriogram may be more sensitive for the detection of renal artery stenosis. 2. No convincing evidence of hemodynamically significant renal artery stenosis by duplex ultrasound. 3. Renal size discrepancy. The left kidney is smaller than the right.  Carotid artery duplex 10/04/2021:  Duplex suggests stenosis in the right internal carotid artery (16-49%).  Duplex suggests stenosis in the right external carotid artery (<50%).  Heterogeneous plaque noted.  Duplex suggests stenosis in the left internal carotid artery (50-69%).  Duplex suggests stenosis in the left external carotid artery (<50%).  Homogeneous plaque noted.  Antegrade right vertebral artery flow. Antegrade left vertebral artery flow.  Compared to the study done on 03/27/2021, right ICA stenosis severity has reduced from 50 to 69% to the present less than 50%. Follow up in six months is appropriate if clinically indicated.   Recent labs: 06/27/2023: Glucose 88, BUN/Cr 28/1.53.  EGFR 37. Na/K 140/4.4.  H/H 11.4 Chol 153, TG 98, HDL 52, LDL 83  02/18/2023: H/H 9/27. MCV 89. Platelets 308  01/08/2023: Glucose 92, BUN/Cr 23/1.52. EGFR 37. Na/K 144/5.3. Rest of the CMP normal H/H 9/27. MCV 89. Platelets 308 HbA1C 5.9% Chol 152, TG 109, HDL 56, LDL 76 TSH 1.0 normal    Review of Systems  Cardiovascular:  Positive for claudication (improving). Negative for chest pain, dyspnea on exertion, leg swelling, palpitations and syncope.  Musculoskeletal:        B/l feet pain - improving       Vitals:   08/08/23 0904  BP: (!) 149/73  Pulse: 69  Resp: 16  SpO2: 98%      Body mass index is 34.45 kg/m. Filed Weights   08/08/23 0904  Weight: 207 lb (93.9 kg)      Objective:   Physical Exam Vitals and nursing note reviewed.  Constitutional:      General: She is not in acute distress. Neck:     Vascular: No JVD.  Cardiovascular:     Rate and Rhythm: Normal rate and regular rhythm.     Pulses:          Femoral pulses are 1+ on the right side and 1+ on the left  side.      Popliteal pulses are 1+ on the right side and 1+ on the left side.       Dorsalis pedis pulses are 1+ on the right side and 1+ on the left side.       Posterior tibial pulses are 0 on the right side and 0 on the left side.     Heart sounds: Normal heart sounds. No murmur heard. Pulmonary:     Effort: Pulmonary effort is normal.     Breath sounds: Normal breath sounds. No wheezing or rales.  Musculoskeletal:     Right lower leg: No edema.     Left lower leg: No edema.  Feet:     Right foot:     Skin integrity: Dry skin present. No ulcer.     Toenail Condition: Right toenails are abnormally thick.     Left foot:     Skin integrity: Dry skin present. No ulcer.     Toenail Condition: Left toenails are abnormally thick.     Visit diagnoses:   ICD-10-CM   1. PAD (peripheral artery disease) (HCC)  I73.9 CBC    Basic metabolic panel    Lipid panel    2. Claudication in  peripheral vascular disease (HCC)  I73.9 CBC    Basic metabolic panel    Lipid panel    3. Mixed hyperlipidemia  E78.2     4. Essential hypertension  I10          Assessment & Recommendations:   67 y.o. Caucasian female with hypertension, hyperlipidemia, hypothyroidism, multiple sclerosis, CAD, PAD, syncope, HFpEF  PAD: S/p intervention to left SFA with atherectomy and overlapping drug-eluting stents (01/2022). Claudication worse in left leg, Rutherford class IIIb. Continue aspirin 81 mg daily, Pletal 50 mg bid. Given her lifestyle limiting claudication in spite of medical therapy and walking, recommend lower extremity and abdominal angiogram, and possible intervention with Dr. Jacinto Halim.  I suspect her hemodynamically significant stenosis may be in her aortoiliac arteries.  HFpEF: Euvolemic today.  Continue Jardiance.  Syncope: No recurrence. No significant arrhythmia on monitor.  CAD: Seen on CT chest 07/2020 No angina symptoms at this time.  Continue medical management. See below regarding lipid management.  Mixed hyperlipidemia: Continue Leqvio. Check lipid panel.  Hypertension: BP elevated today, generally well controlled.  No change made today.  Carotid artery disease: Asymptomatic, moderate, bilateral  F/u in 3 months    Elder Negus, MD Pager: [phone] Office: [phone]

## 2023-08-07 NOTE — H&P (View-Only) (Signed)
Patient referred by Dr. Ralene Ok for coronary artery disease, carotid bruit  Subjective:   Monica Park, female    DOB: [birth year], 67 y.o.   MRN: [id number]    Chief Complaint  Patient presents with   PAD   Follow-up     HPI  67 y.o. Caucasian female with hypertension, hyperlipidemia, hypothyroidism, multiple sclerosis, CAD, PAD  Patient is here with her sister.  She has continued to have limiting claudication in spite of optimal medical therapy and regular walking.  While she is able to walk 5000-8000 steps most days, she has some days where she can barely make it back from her driveway.  Hemoglobin has improved.  Blood pressure elevated today, but reportedly much lower at home.    Current Outpatient Medications:    acetaminophen (TYLENOL) 650 MG CR tablet, Take 1,300 mg by mouth daily., Disp: , Rfl:    albuterol (VENTOLIN HFA) 108 (90 Base) MCG/ACT inhaler, Inhale 2 puffs into the lungs every 4 (four) hours as needed for wheezing or shortness of breath., Disp: , Rfl:    cetirizine (ZYRTEC) 10 MG tablet, Take 10 mg by mouth daily., Disp: , Rfl:    empagliflozin (JARDIANCE) 10 MG TABS tablet, Take 1 tablet (10 mg total) by mouth daily before breakfast., Disp: 90 tablet, Rfl: 2   Fluticasone-Umeclidin-Vilant (TRELEGY ELLIPTA) 100-62.5-25 MCG/ACT AEPB, Inhale into the lungs., Disp: , Rfl:    furosemide (LASIX) 40 MG tablet, TAKE 1 TABLET BY MOUTH DAILY, Disp: 90 tablet, Rfl: 0   gabapentin (NEURONTIN) 600 MG tablet, TAKE ONE TABLET BY MOUTH THREE TIMES A DAY, Disp: 90 tablet, Rfl: 1   inclisiran (LEQVIO) 284 MG/1.5ML SOSY injection, Inject 284 mg into the skin once., Disp: , Rfl:    levothyroxine (SYNTHROID) 25 MCG tablet, Take 25 mcg by mouth daily., Disp: , Rfl:    losartan (COZAAR) 50 MG tablet, Take 50 mg by mouth daily., Disp: , Rfl:    montelukast (SINGULAIR) 10 MG tablet, Take 10 mg by mouth daily., Disp: , Rfl:    ondansetron (ZOFRAN-ODT) 4 MG disintegrating  tablet, Take 1 tablet (4 mg total) by mouth every 8 (eight) hours as needed for nausea or vomiting. (Patient not taking: Reported on 07/02/2023), Disp: 20 tablet, Rfl: 0   pantoprazole (PROTONIX) 40 MG tablet, Take 40 mg by mouth daily., Disp: , Rfl:     Cardiovascular and other pertinent studies:  EKG 01/08/2023: Sinus rhythm 60 bpm Normal EKG  Lower Extremity Arterial Duplex 07/25/2023: No hemodynamically significant stenoses are identified in the right lower extremity arterial system, diffuse monophasic waveform pattern with mild heterogeneous plaque throughout the right iliac and CFA and PFA.  Moderate velocity increase at the left profunda femoral artery suggests >50% stenosis. There is a patent stent in the left SFA with mild diffuse neo-intimal hyperplasia and diffuse monophasic waveforms throughout the left lower extremity.  This exam reveals mildly decreased perfusion of the left lower extremity, noted at the dorsalis pedis and post tibial artery level (ABI 0.96).  Right ABI could not be obtained due to patient comfort.  Bilateral severely abnormal monophasic waveform pattern suggests diffuse disease.  Compared to the study done on 11/20/2022, right EIA and CFA velocity to suggest hemodynamically significant stenosis is not noted in the present study.  Bilateral ABI was not obtained previously again due to patient discomfort.    Echocardiogram 10/03/2022:  Left ventricle cavity is normal in size and wall thickness. Normal global  wall motion. Normal  LV systolic function with EF 62%. Doppler evidence of  grade I (impaired) diastolic dysfunction, normal LAP.  No significant valvular abnormality.  No evidence of pulmonary hypertension.  Following findings noted on 02/07/2021 not appreciated on this study: Mild  left atrial dilatation, mild MR, dilated IVC.   Mobile cardiac telemetry 13 days 08/20/2022 - 09/03/2022: Dominant rhythm: Sinus. HR 45-139 bpm. Avg HR 73 bpm, in sinus  rhythm. 15 episodes of SVT, fastest at 211 bpm for 17 beats, longest for 9.3 secs at 138 bpm. <1% isolated SVE,  couplet/triplets. 0 episodes of VT. <1% isolated VE, no couplet/triplets. No atrial fibrillation/atrial flutter/VT/high grade AV block, sinus pause >3sec noted. 3 patient triggered events, did not correlate with SVT< correlated with sinus rhythm/artifact.   Abdominal aortogram with lower extremity intervention 02/12/2022: Patent b/l aortoiliac arteries with mild disease Flush occluded Lt SFA CTO, reconstituting in distal SFA through collaterals from profunda Two vessel below the knee run-off with occluded ATA   Complex peripheral intervention: Directional atherectomy Lt SFA     PTA and overlapping stenting Lt SFA     Eluvia drug eluting stents 6.0X150 cm, 6.0X150 cm, 6.0X60 mm     Post dilatation with 6.0 mm balloons up to 24 atm  Renal artery duplex 01/08/2022: 1. Technically challenging examination secondary to patient body habitus. CT or MR arteriogram may be more sensitive for the detection of renal artery stenosis. 2. No convincing evidence of hemodynamically significant renal artery stenosis by duplex ultrasound. 3. Renal size discrepancy. The left kidney is smaller than the right.  Carotid artery duplex 10/04/2021:  Duplex suggests stenosis in the right internal carotid artery (16-49%).  Duplex suggests stenosis in the right external carotid artery (<50%).  Heterogeneous plaque noted.  Duplex suggests stenosis in the left internal carotid artery (50-69%).  Duplex suggests stenosis in the left external carotid artery (<50%).  Homogeneous plaque noted.  Antegrade right vertebral artery flow. Antegrade left vertebral artery flow.  Compared to the study done on 03/27/2021, right ICA stenosis severity has reduced from 50 to 69% to the present less than 50%. Follow up in six months is appropriate if clinically indicated.   Recent labs: 06/27/2023: Glucose 88, BUN/Cr 28/1.53.  EGFR 37. Na/K 140/4.4.  H/H 11.4 Chol 153, TG 98, HDL 52, LDL 83  02/18/2023: H/H 9/27. MCV 89. Platelets 308  01/08/2023: Glucose 92, BUN/Cr 23/1.52. EGFR 37. Na/K 144/5.3. Rest of the CMP normal H/H 9/27. MCV 89. Platelets 308 HbA1C 5.9% Chol 152, TG 109, HDL 56, LDL 76 TSH 1.0 normal    Review of Systems  Cardiovascular:  Positive for claudication (improving). Negative for chest pain, dyspnea on exertion, leg swelling, palpitations and syncope.  Musculoskeletal:        B/l feet pain - improving       Vitals:   08/08/23 0904  BP: (!) 149/73  Pulse: 69  Resp: 16  SpO2: 98%      Body mass index is 34.45 kg/m. Filed Weights   08/08/23 0904  Weight: 207 lb (93.9 kg)      Objective:   Physical Exam Vitals and nursing note reviewed.  Constitutional:      General: She is not in acute distress. Neck:     Vascular: No JVD.  Cardiovascular:     Rate and Rhythm: Normal rate and regular rhythm.     Pulses:          Femoral pulses are 1+ on the right side and 1+ on the left  side.      Popliteal pulses are 1+ on the right side and 1+ on the left side.       Dorsalis pedis pulses are 1+ on the right side and 1+ on the left side.       Posterior tibial pulses are 0 on the right side and 0 on the left side.     Heart sounds: Normal heart sounds. No murmur heard. Pulmonary:     Effort: Pulmonary effort is normal.     Breath sounds: Normal breath sounds. No wheezing or rales.  Musculoskeletal:     Right lower leg: No edema.     Left lower leg: No edema.  Feet:     Right foot:     Skin integrity: Dry skin present. No ulcer.     Toenail Condition: Right toenails are abnormally thick.     Left foot:     Skin integrity: Dry skin present. No ulcer.     Toenail Condition: Left toenails are abnormally thick.     Visit diagnoses:   ICD-10-CM   1. PAD (peripheral artery disease) (HCC)  I73.9 CBC    Basic metabolic panel    Lipid panel    2. Claudication in  peripheral vascular disease (HCC)  I73.9 CBC    Basic metabolic panel    Lipid panel    3. Mixed hyperlipidemia  E78.2     4. Essential hypertension  I10          Assessment & Recommendations:   67 y.o. Caucasian female with hypertension, hyperlipidemia, hypothyroidism, multiple sclerosis, CAD, PAD, syncope, HFpEF  PAD: S/p intervention to left SFA with atherectomy and overlapping drug-eluting stents (01/2022). Claudication worse in left leg, Rutherford class IIIb. Continue aspirin 81 mg daily, Pletal 50 mg bid. Given her lifestyle limiting claudication in spite of medical therapy and walking, recommend lower extremity and abdominal angiogram, and possible intervention with Dr. Jacinto Halim.  I suspect her hemodynamically significant stenosis may be in her aortoiliac arteries.  HFpEF: Euvolemic today.  Continue Jardiance.  Syncope: No recurrence. No significant arrhythmia on monitor.  CAD: Seen on CT chest 07/2020 No angina symptoms at this time.  Continue medical management. See below regarding lipid management.  Mixed hyperlipidemia: Continue Leqvio. Check lipid panel.  Hypertension: BP elevated today, generally well controlled.  No change made today.  Carotid artery disease: Asymptomatic, moderate, bilateral  F/u in 3 months    Elder Negus, MD Pager: [phone] Office: [phone]

## 2023-08-08 ENCOUNTER — Encounter: Payer: Self-pay | Admitting: Cardiology

## 2023-08-08 ENCOUNTER — Ambulatory Visit: Payer: 59 | Admitting: Cardiology

## 2023-08-08 VITALS — BP 149/73 | HR 69 | Resp 16 | Ht 65.0 in | Wt 207.0 lb

## 2023-08-08 DIAGNOSIS — E782 Mixed hyperlipidemia: Secondary | ICD-10-CM

## 2023-08-08 DIAGNOSIS — I10 Essential (primary) hypertension: Secondary | ICD-10-CM

## 2023-08-08 DIAGNOSIS — I739 Peripheral vascular disease, unspecified: Secondary | ICD-10-CM

## 2023-08-28 LAB — CBC
Hematocrit: 37 % (ref 34.0–46.6)
Hemoglobin: 12.2 g/dL (ref 11.1–15.9)
MCH: 29.5 pg (ref 26.6–33.0)
MCHC: 33 g/dL (ref 31.5–35.7)
MCV: 90 fL (ref 79–97)
Platelets: 233 10*3/uL (ref 150–450)
RBC: 4.13 x10E6/uL (ref 3.77–5.28)
RDW: 13.6 % (ref 11.7–15.4)
WBC: 7.6 10*3/uL (ref 3.4–10.8)

## 2023-08-28 LAB — BASIC METABOLIC PANEL
BUN/Creatinine Ratio: 17 (ref 12–28)
BUN: 30 mg/dL — ABNORMAL HIGH (ref 8–27)
CO2: 21 mmol/L (ref 20–29)
Calcium: 9.6 mg/dL (ref 8.7–10.3)
Chloride: 104 mmol/L (ref 96–106)
Creatinine, Ser: 1.72 mg/dL — ABNORMAL HIGH (ref 0.57–1.00)
Glucose: 84 mg/dL (ref 70–99)
Potassium: 5.2 mmol/L (ref 3.5–5.2)
Sodium: 141 mmol/L (ref 134–144)
eGFR: 32 mL/min/{1.73_m2} — ABNORMAL LOW (ref >59)

## 2023-08-28 LAB — LIPID PANEL
Chol/HDL Ratio: 3.2 ratio (ref 0.0–4.4)
Cholesterol, Total: 162 mg/dL (ref 100–199)
HDL: 50 mg/dL (ref >39)
LDL Chol Calc (NIH): 87 mg/dL (ref 0–99)
Triglycerides: 142 mg/dL (ref 0–149)
VLDL Cholesterol Cal: 25 mg/dL (ref 5–40)

## 2023-09-05 ENCOUNTER — Ambulatory Visit (HOSPITAL_COMMUNITY)
Admission: RE | Admit: 2023-09-05 | Discharge: 2023-09-05 | Disposition: A | Discharge status: Home / Self Care | Payer: 59 | Attending: Cardiology | Admitting: Cardiology

## 2023-09-05 ENCOUNTER — Encounter (HOSPITAL_COMMUNITY)
Admission: RE | Disposition: A | Discharge status: Home / Self Care | Payer: Self-pay | Source: Home / Self Care | Attending: Cardiology

## 2023-09-05 ENCOUNTER — Telehealth: Payer: Self-pay | Admitting: Pharmacy Technician

## 2023-09-05 ENCOUNTER — Other Ambulatory Visit: Payer: Self-pay

## 2023-09-05 DIAGNOSIS — R55 Syncope and collapse: Secondary | ICD-10-CM | POA: Insufficient documentation

## 2023-09-05 DIAGNOSIS — E782 Mixed hyperlipidemia: Secondary | ICD-10-CM | POA: Insufficient documentation

## 2023-09-05 DIAGNOSIS — I110 Hypertensive heart disease with heart failure: Secondary | ICD-10-CM | POA: Insufficient documentation

## 2023-09-05 DIAGNOSIS — Z79899 Other long term (current) drug therapy: Secondary | ICD-10-CM | POA: Diagnosis not present

## 2023-09-05 DIAGNOSIS — I5032 Chronic diastolic (congestive) heart failure: Secondary | ICD-10-CM | POA: Insufficient documentation

## 2023-09-05 DIAGNOSIS — I2510 Atherosclerotic heart disease of native coronary artery without angina pectoris: Secondary | ICD-10-CM | POA: Insufficient documentation

## 2023-09-05 DIAGNOSIS — I6523 Occlusion and stenosis of bilateral carotid arteries: Secondary | ICD-10-CM | POA: Diagnosis not present

## 2023-09-05 DIAGNOSIS — I739 Peripheral vascular disease, unspecified: Secondary | ICD-10-CM | POA: Diagnosis present

## 2023-09-05 DIAGNOSIS — Z7902 Long term (current) use of antithrombotics/antiplatelets: Secondary | ICD-10-CM | POA: Insufficient documentation

## 2023-09-05 DIAGNOSIS — G35 Multiple sclerosis: Secondary | ICD-10-CM | POA: Diagnosis not present

## 2023-09-05 DIAGNOSIS — E039 Hypothyroidism, unspecified: Secondary | ICD-10-CM | POA: Diagnosis not present

## 2023-09-05 DIAGNOSIS — Z7982 Long term (current) use of aspirin: Secondary | ICD-10-CM | POA: Insufficient documentation

## 2023-09-05 HISTORY — PX: ABDOMINAL AORTOGRAM W/LOWER EXTREMITY: CATH118223

## 2023-09-05 SURGERY — ABDOMINAL AORTOGRAM W/LOWER EXTREMITY
Anesthesia: LOCAL

## 2023-09-05 MED ORDER — ACETAMINOPHEN 325 MG PO TABS: 650.0000 mg | ORAL_TABLET | ORAL | Status: DC | PRN

## 2023-09-05 MED ORDER — NITROGLYCERIN 1 MG/10 ML FOR IR/CATH LAB
INTRA_ARTERIAL | Status: AC
  Filled 2023-09-05: qty 10

## 2023-09-05 MED ORDER — FENTANYL CITRATE (PF) 100 MCG/2ML IJ SOLN
INTRAMUSCULAR | Status: DC | PRN
  Administered 2023-09-05 (×2): 50 ug via INTRAVENOUS

## 2023-09-05 MED ORDER — SODIUM CHLORIDE 0.9 % WEIGHT BASED INFUSION: 1.0000 mL/kg/h | INTRAVENOUS | Status: DC

## 2023-09-05 MED ORDER — FENTANYL CITRATE (PF) 100 MCG/2ML IJ SOLN
INTRAMUSCULAR | Status: AC
  Filled 2023-09-05: qty 2

## 2023-09-05 MED ORDER — HYDRALAZINE HCL 20 MG/ML IJ SOLN: 5.0000 mg | INTRAMUSCULAR | Status: DC | PRN

## 2023-09-05 MED ORDER — IODIXANOL 320 MG/ML IV SOLN
INTRAVENOUS | Status: DC | PRN
  Administered 2023-09-05: 60 mL

## 2023-09-05 MED ORDER — SODIUM CHLORIDE 0.9% FLUSH: 3.0000 mL | INTRAVENOUS | Status: DC | PRN

## 2023-09-05 MED ORDER — SODIUM CHLORIDE 0.9% FLUSH: 3.0000 mL | Freq: Two times a day (BID) | INTRAVENOUS | Status: DC

## 2023-09-05 MED ORDER — SODIUM CHLORIDE 0.9 % IV SOLN: 250.0000 mL | INTRAVENOUS | Status: DC | PRN

## 2023-09-05 MED ORDER — SODIUM CHLORIDE 0.9 % IV SOLN: INTRAVENOUS | Status: DC

## 2023-09-05 MED ORDER — LIDOCAINE HCL (PF) 1 % IJ SOLN
INTRAMUSCULAR | Status: AC
  Filled 2023-09-05: qty 30

## 2023-09-05 MED ORDER — HEPARIN (PORCINE) IN NACL 1000-0.9 UT/500ML-% IV SOLN
INTRAVENOUS | Status: DC | PRN
  Administered 2023-09-05 (×2): 500 mL

## 2023-09-05 MED ORDER — MIDAZOLAM HCL 2 MG/2ML IJ SOLN
INTRAMUSCULAR | Status: AC
  Filled 2023-09-05: qty 2

## 2023-09-05 MED ORDER — NITROGLYCERIN 1 MG/10 ML FOR IR/CATH LAB
INTRA_ARTERIAL | Status: DC | PRN
  Administered 2023-09-05: 400 ug via INTRA_ARTERIAL
  Administered 2023-09-05: 100 ug via INTRAVENOUS

## 2023-09-05 MED ORDER — MIDAZOLAM HCL 2 MG/2ML IJ SOLN
INTRAMUSCULAR | Status: DC | PRN
  Administered 2023-09-05: 2 mg via INTRAVENOUS

## 2023-09-05 MED ORDER — ONDANSETRON HCL 4 MG/2ML IJ SOLN: 4.0000 mg | Freq: Four times a day (QID) | INTRAMUSCULAR | Status: DC | PRN

## 2023-09-05 MED ORDER — SODIUM CHLORIDE 0.9 % IV BOLUS
500.0000 mL | Freq: Once | INTRAVENOUS | Status: AC
  Administered 2023-09-05: 500 mL via INTRAVENOUS

## 2023-09-05 MED ORDER — LIDOCAINE HCL (PF) 1 % IJ SOLN
INTRAMUSCULAR | Status: DC | PRN
  Administered 2023-09-05: 12 mL

## 2023-09-05 SURGICAL SUPPLY — 16 items
BAG SNAP BAND KOVER 36X36 (MISCELLANEOUS) IMPLANT
CATH ANGIO 5F PIGTAIL 65CM (CATHETERS) IMPLANT
CATH STRAIGHT 5FR 65CM (CATHETERS) IMPLANT
CLOSURE MYNX CONTROL 5F (Vascular Products) IMPLANT
COVER DOME SNAP 22 D (MISCELLANEOUS) IMPLANT
GUIDEWIRE NITREX 0.018X80X5 (WIRE) IMPLANT
KIT ANGIASSIST CO2 SYSTEM (KITS) IMPLANT
KIT MICROPUNCTURE NIT STIFF (SHEATH) IMPLANT
KIT PV (KITS) ×1 IMPLANT
KIT SYRINGE INJ CVI SPIKEX1 (MISCELLANEOUS) IMPLANT
SET ATX-X65L (MISCELLANEOUS) IMPLANT
SHEATH PINNACLE 5F 10CM (SHEATH) IMPLANT
SHEATH PROBE COVER 6X72 (BAG) IMPLANT
TRANSDUCER W/STOPCOCK (MISCELLANEOUS) ×1 IMPLANT
TRAY PV CATH (CUSTOM PROCEDURE TRAY) ×1 IMPLANT
WIRE HI TORQ VERSACORE 300 (WIRE) IMPLANT

## 2023-09-05 NOTE — Progress Notes (Signed)
Patient and sister was given discharge instructions. Both verbalized understanding.

## 2023-09-05 NOTE — Telephone Encounter (Signed)
Auth Submission: APPROVED Site of care: Site of care: [facility] Payer: [employer] Medication & CPT/J Code(s) submitted: Leqvio (Inclisiran) [id number] Route of submission (phone, fax, portal): PORTAL Phone # Fax # Auth type: Buy/Bill PB Units/visits requested: X2 Reference number: [id number] Approval from: 08/22/23 to 08/21/24

## 2023-09-05 NOTE — Interval H&P Note (Signed)
History and Physical Interval Note:  09/05/2023 8:25 AM  Monica Park  has presented today for surgery, with the diagnosis of claudication.  The various methods of treatment have been discussed with the patient and family. After consideration of risks, benefits and other options for treatment, the patient has consented to  Procedure(s): ABDOMINAL AORTOGRAM W/LOWER EXTREMITY (N/A) and possible angioplasty as a surgical intervention.  The patient's history has been reviewed, patient examined, no change in status, stable for surgery.  I have reviewed the patient's chart and labs.  Questions were answered to the patient's satisfaction.     Yates Decamp

## 2023-09-08 ENCOUNTER — Encounter (HOSPITAL_COMMUNITY): Payer: Self-pay | Admitting: Cardiology

## 2023-10-24 ENCOUNTER — Other Ambulatory Visit: Payer: Self-pay | Admitting: Cardiology

## 2023-12-15 ENCOUNTER — Ambulatory Visit: Payer: 59

## 2023-12-15 MED ORDER — INCLISIRAN SODIUM 284 MG/1.5ML ~~LOC~~ SOSY: 284.0000 mg | PREFILLED_SYRINGE | Freq: Once | SUBCUTANEOUS | Status: DC

## 2023-12-19 ENCOUNTER — Encounter: Payer: Self-pay | Admitting: Neurology

## 2023-12-19 ENCOUNTER — Encounter: Payer: Self-pay | Admitting: Cardiology

## 2024-01-05 MED ORDER — INCLISIRAN SODIUM 284 MG/1.5ML ~~LOC~~ SOSY: 284.0000 mg | PREFILLED_SYRINGE | Freq: Once | SUBCUTANEOUS | Status: DC

## 2024-01-06 ENCOUNTER — Encounter: Payer: Self-pay | Admitting: Cardiology

## 2024-01-07 ENCOUNTER — Ambulatory Visit (INDEPENDENT_AMBULATORY_CARE_PROVIDER_SITE_OTHER): Payer: 59

## 2024-01-07 VITALS — BP 119/71 | HR 61 | Temp 97.6°F | Resp 16 | Ht 65.0 in | Wt 188.6 lb

## 2024-01-07 DIAGNOSIS — E782 Mixed hyperlipidemia: Secondary | ICD-10-CM

## 2024-01-07 MED ORDER — INCLISIRAN SODIUM 284 MG/1.5ML ~~LOC~~ SOSY
284.0000 mg | PREFILLED_SYRINGE | Freq: Once | SUBCUTANEOUS | Status: AC
Start: 2024-01-07 — End: 2024-01-07
  Administered 2024-01-07: 284 mg via SUBCUTANEOUS
  Filled 2024-01-07: qty 1.5

## 2024-01-07 NOTE — Progress Notes (Signed)
 Diagnosis: Hyperlipidemia  Provider:  Mannam, Praveen MD  Procedure: Injection  Leqvio  (inclisiran), Dose: 284 mg, Site: subcutaneous, Number of injections: 1  Injection Site(s): Left lower quad. abdomen  Post Care:  left lower abdomen  Discharge: Condition: Good, Destination: Home . AVS Declined  Performed by:  Lauran Pollard, LPN

## 2024-01-29 ENCOUNTER — Encounter: Payer: Self-pay | Admitting: Cardiology

## 2024-01-29 ENCOUNTER — Other Ambulatory Visit: Payer: Self-pay | Admitting: Internal Medicine

## 2024-01-29 ENCOUNTER — Encounter: Payer: Self-pay | Admitting: Physician Assistant

## 2024-01-29 DIAGNOSIS — R0989 Other specified symptoms and signs involving the circulatory and respiratory systems: Secondary | ICD-10-CM

## 2024-02-02 ENCOUNTER — Ambulatory Visit
Admission: RE | Admit: 2024-02-02 | Discharge: 2024-02-02 | Disposition: A | Discharge status: Home / Self Care | Payer: 59 | Source: Ambulatory Visit | Attending: Internal Medicine | Admitting: Internal Medicine

## 2024-02-02 ENCOUNTER — Encounter: Payer: Self-pay | Admitting: Cardiology

## 2024-02-02 DIAGNOSIS — R0989 Other specified symptoms and signs involving the circulatory and respiratory systems: Secondary | ICD-10-CM
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
## 2024-02-16 ENCOUNTER — Ambulatory Visit (INDEPENDENT_AMBULATORY_CARE_PROVIDER_SITE_OTHER): Payer: 59 | Admitting: Neurology

## 2024-02-16 ENCOUNTER — Encounter: Payer: Self-pay | Admitting: Neurology

## 2024-02-16 VITALS — BP 188/92 | Ht 66.0 in | Wt 190.0 lb

## 2024-02-16 DIAGNOSIS — I10 Essential (primary) hypertension: Secondary | ICD-10-CM | POA: Diagnosis not present

## 2024-02-16 DIAGNOSIS — G894 Chronic pain syndrome: Secondary | ICD-10-CM | POA: Diagnosis not present

## 2024-02-16 DIAGNOSIS — R9082 White matter disease, unspecified: Secondary | ICD-10-CM

## 2024-02-16 NOTE — Patient Instructions (Signed)
 You MAY have MS, but likely it has "burnt out" and does not require an MS medication.  If that would ever be considered, we would need to repeat MRIs and possibly spinal tap.  However, not likely at this point.  Just supportive care

## 2024-03-10 ENCOUNTER — Other Ambulatory Visit: Payer: Self-pay | Admitting: Cardiology

## 2024-03-10 DIAGNOSIS — I5032 Chronic diastolic (congestive) heart failure: Secondary | ICD-10-CM

## 2024-03-12 ENCOUNTER — Ambulatory Visit: Payer: 59 | Admitting: Physician Assistant

## 2024-03-12 NOTE — Progress Notes (Deleted)
 03/12/2024 Monica Park [id number] [birth year]  Referring provider: Ralene Ok, MD Primary GI doctor: {[id number]}  ASSESSMENT AND PLAN:   Assessment and Plan              Patient Care Team: Ralene Ok, MD as PCP - General (Internal Medicine)  HISTORY OF PRESENT ILLNESS: 68 y.o. female with a past medical history of ***and others listed below presents for evaluation of ***.   Discussed the use of AI scribe software for clinical note transcription with the patient, who gave verbal consent to proceed.  History of Present Illness            She  reports that she quit smoking about 2 years ago. Her smoking use included cigarettes. She started smoking about 32 years ago. She has a 15 pack-year smoking history. She has never used smokeless tobacco. She reports that she does not currently use alcohol. She reports that she does not use drugs.  RELEVANT GI HISTORY, IMAGING AND LABS: Results          CBC    Component Value Date/Time   WBC 7.6 08/27/2023 0853   WBC 10.5 02/14/2022 0607   RBC 4.13 08/27/2023 0853   RBC 2.49 (L) 02/14/2022 0607   HGB 12.2 08/27/2023 0853   HCT 37.0 08/27/2023 0853   PLT 233 08/27/2023 0853   MCV 90 08/27/2023 0853   MCH 29.5 08/27/2023 0853   MCH 30.1 02/14/2022 0607   MCHC 33.0 08/27/2023 0853   MCHC 32.2 02/14/2022 0607   RDW 13.6 08/27/2023 0853   LYMPHSABS 2.5 02/14/2022 0607   MONOABS 0.8 02/14/2022 0607   EOSABS 0.8 (H) 02/14/2022 0607   BASOSABS 0.0 02/14/2022 0607   Recent Labs    08/27/23 0853  HGB 12.2    CMP     Component Value Date/Time   NA 141 08/27/2023 0853   K 5.2 08/27/2023 0853   CL 104 08/27/2023 0853   CO2 21 08/27/2023 0853   GLUCOSE 84 08/27/2023 0853   GLUCOSE 121 (H) 02/14/2022 0607   BUN 30 (H) 08/27/2023 0853   CREATININE 1.72 (H) 08/27/2023 0853   CALCIUM 9.6 08/27/2023 0853   PROT 6.0 (L) 02/13/2022 0211   ALBUMIN 2.8 (L) 02/13/2022 0211   AST 14 (L) 02/13/2022 0211   ALT 11  02/13/2022 0211   ALKPHOS 69 02/13/2022 0211   BILITOT 0.3 02/13/2022 0211   GFRNONAA 40 (L) 02/14/2022 0607      Latest Ref Rng & Units 02/13/2022    2:11 AM  Hepatic Function  Total Protein 6.5 - 8.1 g/dL 6.0   Albumin 3.5 - 5.0 g/dL 2.8   AST 15 - 41 U/L 14   ALT 0 - 44 U/L 11   Alk Phosphatase 38 - 126 U/L 69   Total Bilirubin 0.3 - 1.2 mg/dL 0.3       Current Medications:   Current Outpatient Medications (Endocrine & Metabolic):    empagliflozin (JARDIANCE) 10 MG TABS tablet, TAKE 1 TABLET BY MOUTH DAILY BEFORE BREAKFAST   levothyroxine (SYNTHROID) 25 MCG tablet, Take 25 mcg by mouth daily.  Current Outpatient Medications (Cardiovascular):    inclisiran (LEQVIO) 284 MG/1.5ML SOSY injection, Inject 284 mg into the skin See admin instructions. Twice a year   losartan (COZAAR) 50 MG tablet, Take 50 mg by mouth daily.  Current Outpatient Medications (Respiratory):    albuterol (VENTOLIN HFA) 108 (90 Base) MCG/ACT inhaler, Inhale 2 puffs into the lungs every 4 (  four) hours as needed for wheezing or shortness of breath.   cetirizine (ZYRTEC) 10 MG tablet, Take 10 mg by mouth daily.   Fluticasone-Umeclidin-Vilant (TRELEGY ELLIPTA) 100-62.5-25 MCG/ACT AEPB, Inhale 1 puff into the lungs daily.   montelukast (SINGULAIR) 10 MG tablet, Take 10 mg by mouth at bedtime.  Current Outpatient Medications (Analgesics):    acetaminophen (TYLENOL) 650 MG CR tablet, Take 1,300 mg by mouth daily.   aspirin EC 81 MG tablet, Take 81 mg by mouth daily. Swallow whole.   Current Outpatient Medications (Other):    gabapentin (NEURONTIN) 600 MG tablet, TAKE ONE TABLET BY MOUTH THREE TIMES A DAY   ondansetron (ZOFRAN-ODT) 4 MG disintegrating tablet, Take 1 tablet (4 mg total) by mouth every 8 (eight) hours as needed for nausea or vomiting.   pantoprazole (PROTONIX) 40 MG tablet, Take 40 mg by mouth daily.  Medical History:  Past Medical History:  Diagnosis Date   Anxiety    Asthma    Chronic  fatigue syndrome    Constipation    COPD (chronic obstructive pulmonary disease) (HCC)    Fatty liver    Fibromyalgia    GERD (gastroesophageal reflux disease)    H/O blood clots    High cholesterol    Hypertension    Hypothyroidism    Joint pain    Migraine    Obesity    PAD (peripheral artery disease) (HCC)    Prediabetes    SOB (shortness of breath)    Stage 3 chronic kidney disease (HCC)    Swelling of lower extremity    Allergies:  Allergies  Allergen Reactions   Statins Other (See Comments)    Leg pain   Codeine Nausea And Vomiting   Levaquin [Levofloxacin] Nausea And Vomiting   Toradol [Ketorolac Tromethamine] Itching    Headaches    Aspirin     Causes itching in high doses   Penicillins Nausea And Vomiting and Rash     Surgical History:  She  has a past surgical history that includes Cesarean section; Wrist surgery (Left, 2009); OTHER SURGICAL HISTORY (1991); ABDOMINAL AORTOGRAM W/LOWER EXTREMITY (N/A, 02/12/2022); PERIPHERAL VASCULAR ATHERECTOMY (02/12/2022); PERIPHERAL VASCULAR INTERVENTION (02/12/2022); and ABDOMINAL AORTOGRAM W/LOWER EXTREMITY (N/A, 09/05/2023). Family History:  Her family history includes Alcoholism in her father; Cervical cancer in her mother; Dementia in her mother; Heart Problems in her brother; Liver disease in her father; Lung disease in her father; Multiple sclerosis in her cousin; Neuropathy in her brother; Parkinson's disease in her mother; Rheum arthritis in her sister; Sarcoidosis in her sister; Thyroid cancer in her sister.  REVIEW OF SYSTEMS  : All other systems reviewed and negative except where noted in the History of Present Illness.  PHYSICAL EXAM: There were no vitals taken for this visit. Physical Exam          Doree Albee, PA-C 8:09 AM

## 2024-03-16 ENCOUNTER — Encounter: Payer: Self-pay | Admitting: Cardiology

## 2024-04-13 ENCOUNTER — Ambulatory Visit: Payer: 59 | Admitting: Neurology

## 2024-05-04 ENCOUNTER — Ambulatory Visit (INDEPENDENT_AMBULATORY_CARE_PROVIDER_SITE_OTHER): Admitting: Physician Assistant

## 2024-05-04 ENCOUNTER — Encounter: Payer: Self-pay | Admitting: Physician Assistant

## 2024-05-04 VITALS — BP 120/68 | HR 53 | Ht 65.0 in | Wt 193.0 lb

## 2024-05-04 DIAGNOSIS — R110 Nausea: Secondary | ICD-10-CM

## 2024-05-04 DIAGNOSIS — Z6832 Body mass index (BMI) 32.0-32.9, adult: Secondary | ICD-10-CM

## 2024-05-04 DIAGNOSIS — K760 Fatty (change of) liver, not elsewhere classified: Secondary | ICD-10-CM

## 2024-05-04 DIAGNOSIS — K219 Gastro-esophageal reflux disease without esophagitis: Secondary | ICD-10-CM

## 2024-05-04 DIAGNOSIS — Z860101 Personal history of adenomatous and serrated colon polyps: Secondary | ICD-10-CM

## 2024-05-04 DIAGNOSIS — R7989 Other specified abnormal findings of blood chemistry: Secondary | ICD-10-CM | POA: Diagnosis not present

## 2024-05-04 DIAGNOSIS — K921 Melena: Secondary | ICD-10-CM

## 2024-05-04 DIAGNOSIS — Z8719 Personal history of other diseases of the digestive system: Secondary | ICD-10-CM

## 2024-05-04 DIAGNOSIS — Z860100 Personal history of colon polyps, unspecified: Secondary | ICD-10-CM

## 2024-05-04 MED ORDER — PANTOPRAZOLE SODIUM 40 MG PO TBEC: 40.0000 mg | DELAYED_RELEASE_TABLET | Freq: Two times a day (BID) | ORAL | 0 refills | Status: DC

## 2024-05-04 NOTE — Progress Notes (Addendum)
 05/04/2024 Era Hasty [id number] 1956-11-14  Referring provider: Edda Goo, MD Primary GI doctor: Dr. Yvone Herd  ASSESSMENT AND PLAN:  Melena with history of GERD, nausea, denies dysphagia 1 week of black stools about 2 weeks, did take pepto a few times due to nausea, loose stools, no iron supplement Multiple EGD's in [place]  with ulcer On Protonix  40 mg daily, no NSAIDS, no ETOH Negative FOBT in the office, reassuring - increase protonix  to 40 mg BID, given GERD information - Get AB US  - Get iron/ferritin/CBC - will schedule for EGD to rule out PUD with history of ulcers in the past, schedule at [facility], I discussed risks of EGD with patient today, including risk of sedation, bleeding or perforation.  Patient provides understanding and gave verbal consent to proceed.  Fatty liver Isolated Alk phos elevation 2024 No abdominal imaging - recheck LFTs, hepatitis panel - get AB US  - consider full heptocellular work up pending labs/imaging  Personal history of polyps Colonoscopy 2022 [address], not with [facility], will get colonoscopy report from [facility] -Recall colonoscopy pending this report or if any symptoms  Morbid obesity  Body mass index is 32.12 kg/m.  -Patient has been advised to make an attempt to improve diet and exercise patterns to aid in weight loss. -Recommended diet heavy in fruits and veggies and low in animal meats, cheeses, and dairy products, appropriate calorie intake  Multiple sclerosis/cerebrovascular disease Follows with Dr. Festus Hubert neurology  HFpEF 09/2022 EF normal no severe heart valve abnormalities mild MR No SOB, CP  COPD No SOB, not on O2  Stage IIIb CKD  I have reviewed the clinic note as outlined by Santina Cull, PA and agree with the assessment, plan and medical decision making.  Ms. Barreras presents to the office for evaluation and management of melena, GERD, personal history of colon polyps and fatty liver.  Reports  a 2-week history of melena/dark stools.  Was taking Pepto-Bismol given nausea.  States that she has had a history of a form of peptic ulcer in the past in [place] .  Agree with increasing Protonix  to twice daily dosing and scheduling EGD.  Will obtain outside colonoscopy report to determine appropriate interval for next procedure.  Agree with laboratory and imaging for fatty liver evaluation.  Eugenia Hess, MD   Patient Care Team: Edda Goo, MD as PCP - General (Internal Medicine)  HISTORY OF PRESENT ILLNESS: 68 y.o. female with a past medical history listed below presents as a new patient for evaluation of a colonoscopy.   Discussed the use of AI scribe software for clinical note transcription with the patient, who gave verbal consent to proceed.  History of Present Illness   ALONDA BUSSEY is a 68 year old female who presents with a history of black stools and concerns about previous colonoscopy findings.  She experienced black stools that began approximately two weeks ago and lasted for a week before resolving. The stools were loose and black. She was not taking iron supplements but did take Pepto Bismol a couple of times due to nausea. No rectal bleeding or changes in bowel habits aside from the black stools.  She has a history of colonoscopy findings in 2022 where cancerous or precancerous cells were noted in her colon. She is unsure of the exact nature of these findings but recalls being told about the presence of cancer cells. Her last colonoscopy was performed at [facility] on [facility].  She is currently taking pantoprazole  (  Protonix ) daily and experiences nausea and heartburn. No vomiting, reflux, or difficulty swallowing. She denies the use of NSAIDs, alcohol, or any prescription pain medications except for a recent prescription of hydrocodone, which she could not tolerate.  She has a past medical history of falls, with the most recent occurring on March  11, resulting in difficulty moving and walking. She is under the care of neurology for possible multiple sclerosis (MS). She reports occasional dizziness, which she attributes to her falls and possible MS.  She has a history of elevated liver function tests in 2024 and was labeled with fatty liver, although she has not had any imaging studies of her liver. No history of stomach surgeries except for two cesarean sections.  She reports occasional constipation without associated pain or discomfort. She is not currently experiencing any abdominal pain or discomfort.        She  reports that she quit smoking about 2 years ago. Her smoking use included cigarettes. She started smoking about 32 years ago. She has a 15 pack-year smoking history. She has never used smokeless tobacco. She reports that she does not currently use alcohol. She reports that she does not use drugs.  RELEVANT GI HISTORY, IMAGING AND LABS: Results   LABS LFT: Elevated (2024)  DIAGNOSTIC Colonoscopy: Cancerous or precancerous polyps (2022) Endoscopy: Ulcers in distal stomach (2019)      CBC    Component Value Date/Time   WBC 7.6 08/27/2023 0853   WBC 10.5 02/14/2022 0607   RBC 4.13 08/27/2023 0853   RBC 2.49 (L) 02/14/2022 0607   HGB 12.2 08/27/2023 0853   HCT 37.0 08/27/2023 0853   PLT 233 08/27/2023 0853   MCV 90 08/27/2023 0853   MCH 29.5 08/27/2023 0853   MCH 30.1 02/14/2022 0607   MCHC 33.0 08/27/2023 0853   MCHC 32.2 02/14/2022 0607   RDW 13.6 08/27/2023 0853   LYMPHSABS 2.5 02/14/2022 0607   MONOABS 0.8 02/14/2022 0607   EOSABS 0.8 (H) 02/14/2022 0607   BASOSABS 0.0 02/14/2022 0607   Recent Labs    08/27/23 0853  HGB 12.2    CMP     Component Value Date/Time   NA 141 08/27/2023 0853   K 5.2 08/27/2023 0853   CL 104 08/27/2023 0853   CO2 21 08/27/2023 0853   GLUCOSE 84 08/27/2023 0853   GLUCOSE 121 (H) 02/14/2022 0607   BUN 30 (H) 08/27/2023 0853   CREATININE 1.72 (H) 08/27/2023 0853    CALCIUM  9.6 08/27/2023 0853   PROT 6.0 (L) 02/13/2022 0211   ALBUMIN 2.8 (L) 02/13/2022 0211   AST 14 (L) 02/13/2022 0211   ALT 11 02/13/2022 0211   ALKPHOS 69 02/13/2022 0211   BILITOT 0.3 02/13/2022 0211   GFRNONAA 40 (L) 02/14/2022 0607      Latest Ref Rng & Units 02/13/2022    2:11 AM  Hepatic Function  Total Protein 6.5 - 8.1 g/dL 6.0   Albumin 3.5 - 5.0 g/dL 2.8   AST 15 - 41 U/L 14   ALT 0 - 44 U/L 11   Alk Phosphatase 38 - 126 U/L 69   Total Bilirubin 0.3 - 1.2 mg/dL 0.3       Current Medications:   Current Outpatient Medications (Endocrine & Metabolic):    empagliflozin  (JARDIANCE ) 10 MG TABS tablet, TAKE 1 TABLET BY MOUTH DAILY BEFORE BREAKFAST   levothyroxine  (SYNTHROID ) 25 MCG tablet, Take 25 mcg by mouth daily.  Current Outpatient Medications (Cardiovascular):    inclisiran (  LEQVIO ) 284 MG/1.5ML SOSY injection, Inject 284 mg into the skin See admin instructions. Twice a year   losartan  (COZAAR ) 50 MG tablet, Take 50 mg by mouth daily.  Current Outpatient Medications (Respiratory):    cetirizine (ZYRTEC) 10 MG tablet, Take 10 mg by mouth daily.   Fluticasone-Umeclidin-Vilant (TRELEGY ELLIPTA) 100-62.5-25 MCG/ACT AEPB, Inhale 1 puff into the lungs daily.   montelukast  (SINGULAIR ) 10 MG tablet, Take 10 mg by mouth at bedtime.   SPIRIVA RESPIMAT 2.5 MCG/ACT AERS, as directed.  Current Outpatient Medications (Analgesics):    acetaminophen  (TYLENOL ) 650 MG CR tablet, Take 1,300 mg by mouth daily.   aspirin  EC 81 MG tablet, Take 81 mg by mouth daily. Swallow whole.   Current Outpatient Medications (Other):    gabapentin  (NEURONTIN ) 600 MG tablet, TAKE ONE TABLET BY MOUTH THREE TIMES A DAY   ondansetron  (ZOFRAN -ODT) 4 MG disintegrating tablet, Take 1 tablet (4 mg total) by mouth every 8 (eight) hours as needed for nausea or vomiting.   pantoprazole  (PROTONIX ) 40 MG tablet, Take 1 tablet (40 mg total) by mouth 2 (two) times daily before a meal.  Medical History:   Past Medical History:  Diagnosis Date   Anxiety    Asthma    Chronic fatigue syndrome    Constipation    COPD (chronic obstructive pulmonary disease) (HCC)    Fatty liver    Fibromyalgia    GERD (gastroesophageal reflux disease)    H/O blood clots    High cholesterol    Hypertension    Hypothyroidism    Joint pain    Migraine    Obesity    PAD (peripheral artery disease) (HCC)    Prediabetes    SOB (shortness of breath)    Stage 3 chronic kidney disease (HCC)    Swelling of lower extremity    Allergies:  Allergies  Allergen Reactions   Statins Other (See Comments)    Leg pain   Codeine Nausea And Vomiting   Levaquin [Levofloxacin] Nausea And Vomiting   Toradol [Ketorolac Tromethamine] Itching    Headaches    Aspirin      Causes itching in high doses   Penicillins Nausea And Vomiting and Rash     Surgical History:  She  has a past surgical history that includes Cesarean section; Wrist surgery (Left, 2009); OTHER SURGICAL HISTORY (1991); ABDOMINAL AORTOGRAM W/LOWER EXTREMITY (N/A, 02/12/2022); PERIPHERAL VASCULAR ATHERECTOMY (02/12/2022); PERIPHERAL VASCULAR INTERVENTION (02/12/2022); and ABDOMINAL AORTOGRAM W/LOWER EXTREMITY (N/A, 09/05/2023). Family History:  Her family history includes Alcoholism in her father; Cervical cancer in her mother; Dementia in her mother; Heart Problems in her brother; Liver disease in her father; Lung disease in her father; Multiple sclerosis in her cousin; Neuropathy in her brother; Parkinson's disease in her mother; Rheum arthritis in her sister; Sarcoidosis in her sister; Thyroid  cancer in her sister.  REVIEW OF SYSTEMS  : All other systems reviewed and negative except where noted in the History of Present Illness.  PHYSICAL EXAM: BP 120/68   Pulse (!) 53   Ht 5\' 5"  (1.651 m)   Wt 193 lb (87.5 kg)   BMI 32.12 kg/m  Physical Exam   GENERAL APPEARANCE: Well nourished, in no apparent distress. HEENT: No cervical lymphadenopathy,  unremarkable thyroid , sclerae anicteric, conjunctiva pink. RESPIRATORY: Respiratory effort normal, breath sounds equal bilaterally without rales, rhonchi, or wheezing. CARDIO: Regular rate and rhythm with a systolic murmur, peripheral pulses intact. ABDOMEN: Soft, non-distended, active bowel sounds in all four quadrants, no tenderness to palpation,  no rebound, no mass appreciated. RECTAL: No external hemorrhoids or masses, stool brown, hemoccult negative, hemorrhoidal skin tags present. MUSCULOSKELETAL: Full range of motion, normal gait, without edema. SKIN: Dry, intact without rashes or lesions. No jaundice. NEURO: Alert, oriented, no focal deficits. PSYCH: Cooperative, normal mood and affect.      Edmonia Gottron, PA-C 3:45 PM

## 2024-05-04 NOTE — Patient Instructions (Signed)
 Your provider has requested that you go to the basement level for lab work before leaving today. Press "B" on the elevator. The lab is located at the first door on the left as you exit the elevator.  We have sent the following medications to your pharmacy for you to pick up at your convenience: pantoprazole   You have been scheduled for an abdominal ultrasound at [facility] (1st floor of hospital) on 05/06/24 at 9 am. Please arrive 30 minutes prior to your appointment for registration. Make certain not to have anything to eat or drink 6 hours prior to your appointment. Should you need to reschedule your appointment, please contact radiology at [phone]. This test typically takes about 30 minutes to perform.  You have been scheduled for an endoscopy. Please follow written instructions given to you at your visit today.  If you use inhalers (even only as needed), please bring them with you on the day of your procedure.  If you take any of the following medications, they will need to be adjusted prior to your procedure:   DO NOT TAKE 7 DAYS PRIOR TO TEST- Trulicity (dulaglutide) Ozempic, Wegovy (semaglutide) Mounjaro (tirzepatide) Bydureon Bcise (exanatide extended release)  DO NOT TAKE 1 DAY PRIOR TO YOUR TEST Rybelsus (semaglutide) Adlyxin (lixisenatide) Victoza (liraglutide) Byetta (exanatide) ___________________________________________________________________________   Please take your proton pump inhibitor medication, pantoprazole  40 mg TWICE a day for 8 weeks  Please take this medication 30 minutes to 1 hour before meals- this makes it more effective.  Avoid spicy and acidic foods Avoid fatty foods Limit your intake of coffee, tea, alcohol, and carbonated drinks Work to maintain a healthy weight Keep the head of the bed elevated at least 3 inches with blocks or a wedge pillow if you are having any nighttime symptoms Stay upright for 2 hours after eating Avoid meals  and snacks three to four hours before bedtime

## 2024-05-06 ENCOUNTER — Ambulatory Visit (HOSPITAL_COMMUNITY)
Admission: RE | Admit: 2024-05-06 | Discharge: 2024-05-06 | Disposition: A | Discharge status: Home / Self Care | Source: Ambulatory Visit | Attending: Physician Assistant | Admitting: Physician Assistant

## 2024-05-06 DIAGNOSIS — R7989 Other specified abnormal findings of blood chemistry: Secondary | ICD-10-CM | POA: Diagnosis present

## 2024-05-07 ENCOUNTER — Ambulatory Visit: Payer: Self-pay | Admitting: Physician Assistant

## 2024-05-07 DIAGNOSIS — R93429 Abnormal radiologic findings on diagnostic imaging of unspecified kidney: Secondary | ICD-10-CM

## 2024-05-07 DIAGNOSIS — N2882 Megaloureter: Secondary | ICD-10-CM

## 2024-05-07 DIAGNOSIS — M549 Dorsalgia, unspecified: Secondary | ICD-10-CM

## 2024-05-13 ENCOUNTER — Telehealth: Payer: Self-pay | Admitting: Physician Assistant

## 2024-05-13 NOTE — Telephone Encounter (Signed)
 Patient is requesting for her cardiologist to be reached out to prior to CT and EGD to ensure it is okay to proceed. Please advise, thank you.

## 2024-05-14 ENCOUNTER — Telehealth: Payer: Self-pay

## 2024-05-14 ENCOUNTER — Ambulatory Visit (HOSPITAL_COMMUNITY)
Admission: RE | Admit: 2024-05-14 | Discharge: 2024-05-14 | Disposition: A | Discharge status: Home / Self Care | Source: Ambulatory Visit | Attending: Physician Assistant | Admitting: Physician Assistant

## 2024-05-14 DIAGNOSIS — R93429 Abnormal radiologic findings on diagnostic imaging of unspecified kidney: Secondary | ICD-10-CM | POA: Insufficient documentation

## 2024-05-14 DIAGNOSIS — M549 Dorsalgia, unspecified: Secondary | ICD-10-CM | POA: Insufficient documentation

## 2024-05-14 DIAGNOSIS — N2882 Megaloureter: Secondary | ICD-10-CM | POA: Diagnosis present

## 2024-05-14 MED ORDER — IOHEXOL 300 MG/ML  SOLN
100.0000 mL | Freq: Once | INTRAMUSCULAR | Status: AC | PRN
  Administered 2024-05-14: 100 mL via INTRAVENOUS

## 2024-05-14 MED ORDER — IOHEXOL 9 MG/ML PO SOLN
1000.0000 mL | Freq: Once | ORAL | Status: AC
  Administered 2024-05-14: 1000 mL via ORAL

## 2024-05-14 MED ORDER — IOHEXOL 9 MG/ML PO SOLN
ORAL | Status: AC
  Filled 2024-05-14: qty 1000

## 2024-05-14 NOTE — Telephone Encounter (Signed)
 URGENT high priority msg sent to Dr Fransico Ivy via Epic. Attempted to call cardiology office 5 times to get a fax number but every time I get a busy tone.

## 2024-05-14 NOTE — Telephone Encounter (Signed)
 LATISE DILLEY Mar 03, 1956 [id number]  05/14/24   Dear Dr Filiberto Hug:  We have scheduled the above named patient for a(n) EGD procedure.   Please advise as to whether the patient is cleared from a cardiac stand point. Their procedure is scheduled for 05/18/24 at 9 am.  Please route your response to [place], [facility] or fax response to [phone].  Sincerely,    [facility]

## 2024-05-14 NOTE — Telephone Encounter (Signed)
   Patient Name: CODY OLIGER  DOB: 04/14/1956 MRN: [id number]  Primary Cardiologist: None  Chart reviewed as part of pre-operative protocol coverage. Given past medical history and time since last visit, based on ACC/AHA guidelines, REKHA HOBBINS is at acceptable risk for the planned procedure without further cardiovascular testing.   Low cardiac risk.  Okay to proceed. Prefer to continue aspirin  periprocedural.,  If possible.  The patient was advised that if she develops new symptoms prior to surgery to contact our office to arrange for a follow-up visit, and she verbalized understanding.  I will route this recommendation to the requesting party via Epic fax function and remove from pre-op pool.  Please call with questions.  Francene Ing, Retha Cast, NP 05/14/2024, 1:03 PM

## 2024-05-14 NOTE — Telephone Encounter (Signed)
 Pt informed and states understanding. She will continue her aspirin .

## 2024-05-14 NOTE — Telephone Encounter (Signed)
 Clearance received from cardiology (see other telephone encounter). Pt has been informed and states understanding. She will continue her aspirin  per cardiologist recommendation. Pt has no other questions or concerns at this time.

## 2024-05-14 NOTE — Telephone Encounter (Signed)
 Low cardiac risk.  Okay to proceed. Prefer to continue aspirin  periprocedural.,  If possible.  Thanks MJP

## 2024-05-14 NOTE — Telephone Encounter (Signed)
 Pt would like to get cardiac clearance for her CT scheduled today and her EGD scheduled 05/18/24. I advised we wouldn't get clearance in time for her CT today. She asked "is it the same contrast as everywhere else?" I assured her it is. She states understanding and will go ahead with the CT as scheduled. As far as the the EGD I will send an URGENT cardiac clearance letter to Dr. Fransico Ivy. I have informed the pt Santina Cull, PA-C, didn't request this at her appt, it is Friday and we aren't open on Monday d/t the holiday and if we dont get clearance in time she may need to reschedule if that is something she wants. I let her know she may have more luck calling her cardiologists office herself, which she agreed to. She will call and let us  know. In the mean time I will send URGENT letter.

## 2024-05-17 NOTE — Progress Notes (Unsigned)
 [facility] History and Physical   Primary Care Physician:  Edda Goo, MD   Reason for Procedure:  Melena, GERD, nausea, heartburn  Plan:    Upper endoscopy     HPI: EMIYA LOOMER is a 68 y.o. female undergoing upper endoscopy exam for investigation of melena, GERD, nausea and heartburn.  Patient was recently seen in the office reporting a 2-week history of melena/dark stools.  She was taking Pepto-Bismol at the time to alleviate nausea.  Reports a prior history of peptic ulcer disease when residing in [place] .  She was on PPI and this was increased to twice daily dosing.  Labs were ordered including CBC and iron panel but it does not appear these were completed.  Clinic note documents that patient had a colonoscopy in 2022 and states there were "cancerous or precancerous cells in her colon" -last colonoscopy performed at [facility].   Past Medical History:  Diagnosis Date   Anxiety    Asthma    Chronic fatigue syndrome    Constipation    COPD (chronic obstructive pulmonary disease) (HCC)    Fatty liver    Fibromyalgia    GERD (gastroesophageal reflux disease)    H/O blood clots    High cholesterol    Hypertension    Hypothyroidism    Joint pain    Migraine    Obesity    PAD (peripheral artery disease) (HCC)    Prediabetes    SOB (shortness of breath)    Stage 3 chronic kidney disease (HCC)    Swelling of lower extremity     Past Surgical History:  Procedure Laterality Date   ABDOMINAL AORTOGRAM W/LOWER EXTREMITY N/A 02/12/2022   Procedure: ABDOMINAL AORTOGRAM W/LOWER EXTREMITY;  Surgeon: Cody Das, MD;  Location: [facility];  Service: Cardiovascular;  Laterality: N/A;   ABDOMINAL AORTOGRAM W/LOWER EXTREMITY N/A 09/05/2023   Procedure: ABDOMINAL AORTOGRAM W/LOWER EXTREMITY;  Surgeon: Knox Perl, MD;  Location: [facility];  Service: Cardiovascular;  Laterality: N/A;   CESAREAN SECTION     1976, 1979   OTHER  SURGICAL HISTORY  1991   nerve block, right leg   PERIPHERAL VASCULAR ATHERECTOMY  02/12/2022   Procedure: PERIPHERAL VASCULAR ATHERECTOMY;  Surgeon: Cody Das, MD;  Location: [facility];  Service: Cardiovascular;;  LT SFA   PERIPHERAL VASCULAR INTERVENTION  02/12/2022   Procedure: PERIPHERAL VASCULAR INTERVENTION;  Surgeon: Cody Das, MD;  Location: [facility];  Service: Cardiovascular;;  LT SFA   WRIST SURGERY Left 2009   left arm/wrist    Prior to Admission medications   Medication Sig Start Date End Date Taking? Authorizing Provider  acetaminophen  (TYLENOL ) 650 MG CR tablet Take 1,300 mg by mouth daily.    [provider]  aspirin  EC 81 MG tablet Take 81 mg by mouth daily. Swallow whole.    [provider]  cetirizine (ZYRTEC) 10 MG tablet Take 10 mg by mouth daily.    [provider]  empagliflozin  (JARDIANCE ) 10 MG TABS tablet TAKE 1 TABLET BY MOUTH DAILY BEFORE BREAKFAST 03/10/24   Patwardhan, Manish J, MD  Fluticasone-Umeclidin-Vilant (TRELEGY ELLIPTA) 100-62.5-25 MCG/ACT AEPB Inhale 1 puff into the lungs daily.    [provider]  gabapentin  (NEURONTIN ) 600 MG tablet TAKE ONE TABLET BY MOUTH THREE TIMES A DAY 12/05/22   Sater, Sherida Dimmer, MD  inclisiran (LEQVIO ) 284 MG/1.5ML SOSY injection Inject 284 mg into the skin See admin instructions. Twice a  year    [provider]  levothyroxine  (SYNTHROID ) 25 MCG tablet Take 25 mcg by mouth daily. 06/03/20   [provider]  losartan  (COZAAR ) 50 MG tablet Take 50 mg by mouth daily.    [provider]  montelukast  (SINGULAIR ) 10 MG tablet Take 10 mg by mouth at bedtime. 04/22/20   [provider]  ondansetron  (ZOFRAN -ODT) 4 MG disintegrating tablet Take 1 tablet (4 mg total) by mouth every 8 (eight) hours as needed for nausea or vomiting. 04/29/22   Corine Dice, MD  pantoprazole  (PROTONIX ) 40 MG tablet Take 1 tablet (40 mg total) by mouth  2 (two) times daily before a meal. 05/04/24   Edmonia Gottron, PA-C  SPIRIVA RESPIMAT 2.5 MCG/ACT AERS as directed. 04/26/24   [provider]    Current Outpatient Medications  Medication Sig Dispense Refill   acetaminophen  (TYLENOL ) 650 MG CR tablet Take 1,300 mg by mouth daily.     aspirin  EC 81 MG tablet Take 81 mg by mouth daily. Swallow whole.     cetirizine (ZYRTEC) 10 MG tablet Take 10 mg by mouth daily.     empagliflozin  (JARDIANCE ) 10 MG TABS tablet TAKE 1 TABLET BY MOUTH DAILY BEFORE BREAKFAST 90 tablet 1   Fluticasone-Umeclidin-Vilant (TRELEGY ELLIPTA) 100-62.5-25 MCG/ACT AEPB Inhale 1 puff into the lungs daily.     gabapentin  (NEURONTIN ) 600 MG tablet TAKE ONE TABLET BY MOUTH THREE TIMES A DAY 90 tablet 1   inclisiran (LEQVIO ) 284 MG/1.5ML SOSY injection Inject 284 mg into the skin See admin instructions. Twice a year     levothyroxine  (SYNTHROID ) 25 MCG tablet Take 25 mcg by mouth daily.     losartan  (COZAAR ) 50 MG tablet Take 50 mg by mouth daily.     montelukast  (SINGULAIR ) 10 MG tablet Take 10 mg by mouth at bedtime.     ondansetron  (ZOFRAN -ODT) 4 MG disintegrating tablet Take 1 tablet (4 mg total) by mouth every 8 (eight) hours as needed for nausea or vomiting. 20 tablet 0   pantoprazole  (PROTONIX ) 40 MG tablet Take 1 tablet (40 mg total) by mouth 2 (two) times daily before a meal. 180 tablet 0   SPIRIVA RESPIMAT 2.5 MCG/ACT AERS as directed.     No current facility-administered medications for this visit.    Allergies as of 05/18/2024 - Reviewed 05/14/2024  Allergen Reaction Noted   Statins Other (See Comments) 05/17/2021   Codeine Nausea And Vomiting 12/11/2020   Levaquin [levofloxacin] Nausea And Vomiting 12/11/2020   Toradol [ketorolac tromethamine] Itching 12/11/2020   Aspirin   12/11/2020   Penicillins Nausea And Vomiting and Rash 12/11/2020    Family History  Problem Relation Age of Onset   Dementia Mother    Parkinson's disease Mother    Cervical  cancer Mother    Lung disease Father    Liver disease Father    Alcoholism Father    Rheum arthritis Sister    Thyroid  cancer Sister    Sarcoidosis Sister    Neuropathy Brother    Heart Problems Brother    Multiple sclerosis Cousin     Social History   Socioeconomic History   Marital status: Divorced    Spouse name: Not on file   Number of children: 2   Years of education: 10   Highest education level: 11th grade  Occupational History   Occupation: [employer]  Tobacco Use   Smoking status: Former    Current packs/day: 0.00    Average packs/day: 0.5 packs/day for  30.0 years (15.0 ttl pk-yrs)    Types: Cigarettes    Start date: 06/26/1991    Quit date: 06/25/2021    Years since quitting: 2.8   Smokeless tobacco: Never  Vaping Use   Vaping status: Never Used  Substance and Sexual Activity   Alcohol use: Not Currently   Drug use: Never   Sexual activity: Not on file  Other Topics Concern   Not on file  Social History Narrative   Lives with sister, Patty    Right handed   Caffeine use: coffee once daily   Social Drivers of Health   Financial Resource Strain: Not on file  Food Insecurity: Not on file  Transportation Needs: Not on file  Physical Activity: Not on file  Stress: Not on file  Social Connections: Unknown (07/15/2022)   Received from [facility]   Social Network    Social Network: Not on file  Intimate Partner Violence: Unknown (07/15/2022)   Received from [facility]   HITS    Physically Hurt: Not on file    Insult or Talk Down To: Not on file    Threaten Physical Harm: Not on file    Scream or Curse: Not on file    Review of Systems:  All other review of systems negative except as mentioned in the HPI.  Physical Exam: Vital signs There were no vitals taken for this visit.  General:   Alert,  Well-developed, well-nourished, pleasant and cooperative in NAD Airway:  Mallampati  Lungs:  Clear throughout to auscultation.    Heart:  Regular rate and rhythm; no murmurs, clicks, rubs,  or gallops. Abdomen:  Soft, nontender and nondistended. Normal bowel sounds.   Neuro/Psych:  Normal mood and affect. A and O x 3  Eugenia Hess, MD [facility]

## 2024-05-18 ENCOUNTER — Ambulatory Visit: Payer: Self-pay | Admitting: Physician Assistant

## 2024-05-18 ENCOUNTER — Telehealth: Payer: Self-pay

## 2024-05-18 ENCOUNTER — Ambulatory Visit (AMBULATORY_SURGERY_CENTER): Admitting: Pediatrics

## 2024-05-18 ENCOUNTER — Encounter: Payer: Self-pay | Admitting: Pediatrics

## 2024-05-18 VITALS — BP 131/56 | HR 45 | Temp 97.4°F | Resp 10 | Ht 65.0 in | Wt 193.0 lb

## 2024-05-18 DIAGNOSIS — R112 Nausea with vomiting, unspecified: Secondary | ICD-10-CM

## 2024-05-18 DIAGNOSIS — K219 Gastro-esophageal reflux disease without esophagitis: Secondary | ICD-10-CM | POA: Diagnosis not present

## 2024-05-18 DIAGNOSIS — R12 Heartburn: Secondary | ICD-10-CM

## 2024-05-18 DIAGNOSIS — K2950 Unspecified chronic gastritis without bleeding: Secondary | ICD-10-CM | POA: Diagnosis not present

## 2024-05-18 DIAGNOSIS — K921 Melena: Secondary | ICD-10-CM

## 2024-05-18 MED ORDER — METOCLOPRAMIDE HCL 5 MG PO TABS: 5.0000 mg | ORAL_TABLET | Freq: Four times a day (QID) | ORAL | 0 refills | Status: DC

## 2024-05-18 MED ORDER — SODIUM CHLORIDE 0.9 % IV SOLN: 500.0000 mL | INTRAVENOUS | Status: AC

## 2024-05-18 NOTE — Patient Instructions (Signed)
 Please read handouts provided. Await pathology results.   YOU HAD AN ENDOSCOPIC PROCEDURE TODAY AT THE [facility]:   Refer to the procedure report that was given to you for any specific questions about what was found during the examination.  If the procedure report does not answer your questions, please call your gastroenterologist to clarify.  If you requested that your care partner not be given the details of your procedure findings, then the procedure report has been included in a sealed envelope for you to review at your convenience later.  YOU SHOULD EXPECT: Some feelings of bloating in the abdomen. Passage of more gas than usual.  Walking can help get rid of the air that was put into your GI tract during the procedure and reduce the bloating. If you had a lower endoscopy (such as a colonoscopy or flexible sigmoidoscopy) you may notice spotting of blood in your stool or on the toilet paper. If you underwent a bowel prep for your procedure, you may not have a normal bowel movement for a few days.  Please Note:  You might notice some irritation and congestion in your nose or some drainage.  This is from the oxygen used during your procedure.  There is no need for concern and it should clear up in a day or so.  SYMPTOMS TO REPORT IMMEDIATELY:  Following upper endoscopy (EGD)  Vomiting of blood or coffee ground material  New chest pain or pain under the shoulder blades  Painful or persistently difficult swallowing  New shortness of breath  Fever of 100F or higher  Black, tarry-looking stools  For urgent or emergent issues, a gastroenterologist can be reached at any hour by calling [phone]. Do not use MyChart messaging for urgent concerns.    DIET:  We do recommend a small meal at first, but then you may proceed to your regular diet.  Drink plenty of fluids but you should avoid alcoholic beverages for 24 hours.  ACTIVITY:  You should plan to take it easy for the rest  of today and you should NOT DRIVE or use heavy machinery until tomorrow (because of the sedation medicines used during the test).    FOLLOW UP: Our staff will call the number listed on your records the next business day following your procedure.  We will call around 7:15- 8:00 am to check on you and address any questions or concerns that you may have regarding the information given to you following your procedure. If we do not reach you, we will leave a message.     If any biopsies were taken you will be contacted by phone or by letter within the next 1-3 weeks.  Please call us  at [phone] if you have not heard about the biopsies in 3 weeks.    SIGNATURES/CONFIDENTIALITY: You and/or your care partner have signed paperwork which will be entered into your electronic medical record.  These signatures attest to the fact that that the information above on your After Visit Summary has been reviewed and is understood.  Full responsibility of the confidentiality of this discharge information lies with you and/or your care-partner.

## 2024-05-18 NOTE — Progress Notes (Signed)
 Report to PACU, RN, vss, BBS= Clear.

## 2024-05-18 NOTE — Op Note (Signed)
 [facility] Patient Name: Monica Park Procedure Date: 05/18/2024 9:35 AM MRN: [id number] Endoscopist: Eugenia Hess , MD, [id number] Age: 68 Referring MD:  Date of Birth: [birth year] Gender: Female Account #: [id number] Procedure:                Upper GI endoscopy Indications:              Heartburn, Follow-up of gastro-esophageal reflux                            disease, Melena, Nausea Medicines:                Monitored Anesthesia Care Procedure:                Pre-Anesthesia Assessment:                           - Prior to the procedure, a History and Physical                            was performed, and patient medications and                            allergies were reviewed. The patient's tolerance of                            previous anesthesia was also reviewed. The risks                            and benefits of the procedure and the sedation                            options and risks were discussed with the patient.                            All questions were answered, and informed consent                            was obtained. Prior Anticoagulants: The patient has                            taken no anticoagulant or antiplatelet agents                            except for aspirin . ASA Grade Assessment: III - A                            patient with severe systemic disease. After                            reviewing the risks and benefits, the patient was                            deemed in satisfactory condition to undergo the  procedure.                           After obtaining informed consent, the endoscope was                            passed under direct vision. Throughout the                            procedure, the patient's blood pressure, pulse, and                            oxygen saturations were monitored continuously. The                            Olympus Scope SN [id number] was introduced through the                             mouth, and advanced to the second part of duodenum.                            The upper GI endoscopy was accomplished without                            difficulty. The patient tolerated the procedure                            well. Scope In: Scope Out: Findings:                 The examined esophagus was normal.                           The gastric body, gastric antrum, cardia (on                            retroflexion) and gastric fundus (on retroflexion)                            were normal. Mucosa of the gastric body had a faint                            mosaic appearance. Biopsies were taken with a cold                            forceps for Helicobacter pylori testing.                           The duodenal bulb and second portion of the                            duodenum were normal. Complications:            No immediate complications. Estimated blood loss:  Minimal. Estimated Blood Loss:     Estimated blood loss was minimal. Impression:               - Normal esophagus.                           - Normal gastric body, antrum, cardia and gastric                            fundus. Mucosa of the gastric body had a faint                            mosaic appearance. Biopsied. Evaluate for H. pylori                            and portal hypertensive gastropathy.                           - Normal duodenal bulb and second portion of the                            duodenum. Recommendation:           - Discharge patient to home (ambulatory).                           - Await pathology results.                           - The findings and recommendations were discussed                            with the patient's family.                           - Patient has a contact number available for                            emergencies. The signs and symptoms of potential                            delayed complications were discussed  with the                            patient. Return to normal activities tomorrow.                            Written discharge instructions were provided to the                            patient. Eugenia Hess, MD 05/18/2024 9:51:50 AM This report has been signed electronically.

## 2024-05-18 NOTE — Telephone Encounter (Signed)
-----   Message from Truddie Furrow sent at 05/18/2024 10:00 AM EDT ----- Regarding: Cancel Reglan prescription Hi nurses -  I was sending in a Reglan prescription for a patient and opened up the incorrect chart for Ms. Palma Bob -I sent in a prescription of Reglan to her [facility] pharmacy and then canceled it electronically.  Can you please call the pharmacy to confirm that they know that the prescription was sent in error and the prescription has been canceled?  Thanks,  Haskell Linker

## 2024-05-18 NOTE — Telephone Encounter (Signed)
 Called and spoke with Monica Park at the pharmacy. I was informed that order was not showing up on patient's profile and was likely cancelled already.

## 2024-05-18 NOTE — Progress Notes (Signed)
 Pt took 4 pill with a sip of water at 0813. CRNA made aware

## 2024-05-19 ENCOUNTER — Telehealth: Payer: Self-pay | Admitting: *Deleted

## 2024-05-19 NOTE — Telephone Encounter (Signed)
  Follow up Call-     05/18/2024    9:20 AM  Call back number  Post procedure Call Back phone  # [phone]  Permission to leave phone message Yes     Patient questions:  Do you have a fever, pain , or abdominal swelling? No. Pain Score  0 *  Have you tolerated food without any problems? Yes.    Have you been able to return to your normal activities? Yes.    Do you have any questions about your discharge instructions: Diet   No. Medications  No. Follow up visit  No.  Do you have questions or concerns about your Care? No.  Actions: * If pain score is 4 or above: No action needed, pain <4.

## 2024-05-20 ENCOUNTER — Ambulatory Visit: Payer: Self-pay | Admitting: Pediatrics

## 2024-05-20 LAB — SURGICAL PATHOLOGY

## 2024-07-06 ENCOUNTER — Ambulatory Visit: Payer: 59 | Admitting: Pulmonary Disease

## 2024-07-06 MED ORDER — INCLISIRAN SODIUM 284 MG/1.5ML ~~LOC~~ SOSY: 284.0000 mg | PREFILLED_SYRINGE | Freq: Once | SUBCUTANEOUS | Status: DC

## 2024-08-09 ENCOUNTER — Encounter: Payer: Self-pay | Admitting: Cardiology

## 2024-08-09 NOTE — Progress Notes (Signed)
 This encounter was created in error - please disregard.

## 2024-09-19 ENCOUNTER — Other Ambulatory Visit: Payer: Self-pay | Admitting: Cardiology

## 2024-09-19 DIAGNOSIS — I5032 Chronic diastolic (congestive) heart failure: Secondary | ICD-10-CM

## 2024-10-25 ENCOUNTER — Other Ambulatory Visit: Payer: Self-pay | Admitting: Cardiology

## 2024-10-25 DIAGNOSIS — I5032 Chronic diastolic (congestive) heart failure: Secondary | ICD-10-CM

## 2024-11-05 ENCOUNTER — Other Ambulatory Visit: Payer: Self-pay | Admitting: Cardiology

## 2024-11-05 DIAGNOSIS — I5032 Chronic diastolic (congestive) heart failure: Secondary | ICD-10-CM

## 2024-12-03 ENCOUNTER — Other Ambulatory Visit: Payer: Self-pay | Admitting: Physician Assistant

## 2024-12-23 ENCOUNTER — Telehealth: Payer: Self-pay | Admitting: Pharmacy Technician

## 2024-12-23 DIAGNOSIS — E782 Mixed hyperlipidemia: Secondary | ICD-10-CM

## 2024-12-23 DIAGNOSIS — I739 Peripheral vascular disease, unspecified: Secondary | ICD-10-CM

## 2024-12-23 NOTE — Telephone Encounter (Signed)
 Dr. Elmira,  Insurance is requesting updated chart notes and LDL score for the Leqvio  to be re-approved for 2026. Please provide chart notes and LDL score.  Thanks Luke

## 2024-12-23 NOTE — Telephone Encounter (Signed)
 Not seen since 07/2023. Recommend checking lipid panel and BMP, followed by office visit with me or APP. (Diagnosis: PAD)  Thanks MJP

## 2024-12-27 NOTE — Telephone Encounter (Signed)
"  Left message to call back.   "

## 2024-12-29 ENCOUNTER — Encounter: Payer: Self-pay | Admitting: Cardiology

## 2024-12-29 NOTE — Addendum Note (Signed)
 Addended by: MANDA BOTTCHER B on: 12/29/2024 05:59 PM   Modules accepted: Orders

## 2024-12-29 NOTE — Telephone Encounter (Signed)
 Spoke to patient and advised. Lab orders placed and appt made.

## 2025-01-06 ENCOUNTER — Encounter: Payer: Self-pay | Admitting: Cardiology

## 2025-01-13 ENCOUNTER — Other Ambulatory Visit: Payer: Self-pay

## 2025-01-13 DIAGNOSIS — E782 Mixed hyperlipidemia: Secondary | ICD-10-CM

## 2025-01-13 DIAGNOSIS — I10 Essential (primary) hypertension: Secondary | ICD-10-CM

## 2025-01-13 LAB — BASIC METABOLIC PANEL WITH GFR
BUN/Creatinine Ratio: 18 (ref 12–28)
BUN: 26 mg/dL (ref 8–27)
CO2: 22 mmol/L (ref 20–29)
Calcium: 9.4 mg/dL (ref 8.7–10.3)
Chloride: 104 mmol/L (ref 96–106)
Creatinine, Ser: 1.46 mg/dL — ABNORMAL HIGH (ref 0.57–1.00)
Glucose: 76 mg/dL (ref 70–99)
Potassium: 4.5 mmol/L (ref 3.5–5.2)
Sodium: 141 mmol/L (ref 134–144)
eGFR: 39 mL/min/1.73 — ABNORMAL LOW

## 2025-01-13 LAB — LIPID PANEL
Chol/HDL Ratio: 2.4 ratio (ref 0.0–4.4)
Cholesterol, Total: 127 mg/dL (ref 100–199)
HDL: 52 mg/dL
LDL Chol Calc (NIH): 58 mg/dL (ref 0–99)
Triglycerides: 86 mg/dL (ref 0–149)
VLDL Cholesterol Cal: 17 mg/dL (ref 5–40)

## 2025-01-13 NOTE — Progress Notes (Signed)
 Received call from lab, unable to see orders for Lipid panel and BMP. Reordered and released lab orders. Lab tech confirmed they were able to retreive the orders.

## 2025-01-14 ENCOUNTER — Ambulatory Visit: Attending: Cardiology | Admitting: Cardiology

## 2025-01-14 ENCOUNTER — Encounter: Payer: Self-pay | Admitting: Cardiology

## 2025-01-14 VITALS — BP 132/68 | HR 54 | Ht 65.0 in | Wt 193.0 lb

## 2025-01-14 DIAGNOSIS — I10 Essential (primary) hypertension: Secondary | ICD-10-CM

## 2025-01-14 DIAGNOSIS — I6523 Occlusion and stenosis of bilateral carotid arteries: Secondary | ICD-10-CM | POA: Diagnosis not present

## 2025-01-14 DIAGNOSIS — I739 Peripheral vascular disease, unspecified: Secondary | ICD-10-CM

## 2025-01-14 DIAGNOSIS — E782 Mixed hyperlipidemia: Secondary | ICD-10-CM | POA: Diagnosis not present

## 2025-01-14 DIAGNOSIS — R011 Cardiac murmur, unspecified: Secondary | ICD-10-CM | POA: Diagnosis not present

## 2025-01-14 NOTE — Patient Instructions (Signed)
" °  Testing/Procedures: Carotid duplex  Your physician has requested that you have a carotid duplex. This test is an ultrasound of the carotid arteries in your neck. It looks at blood flow through these arteries that supply the brain with blood. Allow one hour for this exam. There are no restrictions or special instructions.  Echocardiogram  Your physician has requested that you have an echocardiogram. Echocardiography is a painless test that uses sound waves to create images of your heart. It provides your doctor with information about the size and shape of your heart and how well your hearts chambers and valves are working. This procedure takes approximately one hour. There are no restrictions for this procedure. Please do NOT wear cologne, perfume, aftershave, or lotions (deodorant is allowed). Please arrive 15 minutes prior to your appointment time.  Please note: We ask at that you not bring children with you during ultrasound (echo/ vascular) testing. Due to room size and safety concerns, children are not allowed in the ultrasound rooms during exams. Our front office staff cannot provide observation of children in our lobby area while testing is being conducted. An adult accompanying a patient to their appointment will only be allowed in the ultrasound room at the discretion of the ultrasound technician under special circumstances. We apologize for any inconvenience.  ABI  Your physician has requested that you have a lower or upper extremity arterial duplex. This test is an ultrasound of the arteries in the legs or arms. It looks at arterial blood flow in the legs and arms. Allow one hour for Lower and Upper Arterial scans. There are no restrictions or special instructions.  Please note: We ask at that you not bring children with you during ultrasound (echo/ vascular) testing. Due to room size and safety concerns, children are not allowed in the ultrasound rooms during exams. Our front office  staff cannot provide observation of children in our lobby area while testing is being conducted. An adult accompanying a patient to their appointment will only be allowed in the ultrasound room at the discretion of the ultrasound technician under special circumstances. We apologize for any inconvenience.   Follow-Up: At [facility], you and your health needs are our priority.  As part of our continuing mission to provide you with exceptional heart care, our providers are all part of one team.  This team includes your primary Cardiologist (physician) and Advanced Practice Providers or APPs (Physician Assistants and Nurse Practitioners) who all work together to provide you with the care you need, when you need it.  Your next appointment:   1 year(s)  Provider:   Newman JINNY Lawrence, MD            "

## 2025-01-14 NOTE — Progress Notes (Signed)
 " Cardiology Office Note:  .   Date:  01/14/2025  ID:  Margo, Lama 1956/05/17, MRN [id number] PCP: Valma Carwin, MD  [facility] Providers Cardiologist:  Newman Lawrence, MD PCP: Valma Carwin, MD  Chief Complaint  Patient presents with   Peripheral artery disease     Monica Park is a 69 y.o. female with ypertension, hyperlipidemia, hypothyroidism, multiple sclerosis, CAD, PAD    Discussed the use of AI scribe software for clinical note transcription with the patient, who gave verbal consent to proceed.  History of Present Illness FEY COGHILL is a 69 year old female with peripheral artery disease and hyperlipidemia who presents for follow-up on her cholesterol management.  She has peripheral artery disease and hyperlipidemia and has missed her scheduled cholesterol injection since her last visit. She is physically active and denies chest pain, shortness of breath, or calf or thigh pain with walking.  She takes aspirin  81 mg nightly, Jardiance  10 mg for fluid related to cardiac function, and losartan  50 mg. Her most recent creatinine was 1.46.  Patient previously had atherectomy, PTA and stenting of left SFA CTO with improvement in her claudication symptoms.  She also had mild bilateral aortoiliac disease.      Vitals:   01/14/25 1151  BP: 132/68  Pulse: (!) 54  SpO2: 98%      Review of Systems  Cardiovascular:  Negative for chest pain, dyspnea on exertion, leg swelling, palpitations and syncope.        Studies Reviewed: SABRA        EKG 01/14/2025: Sinus bradycardia 54 bpm No previous ECGs available    Echocardiogram 09/2022: Left ventricle cavity is normal in size and wall thickness. Normal global wall motion. Normal LV systolic function with EF 62%. Doppler evidence of grade I (impaired) diastolic dysfunction, normal LAP. No significant valvular abnormality. No evidence of pulmonary hypertension. Following findings noted on 02/07/2021  not appreciated on this study: Mild left atrial dilatation, mild MR, dilated IVC.  Labs 12/2024: Chol 127, TG 86, HDL 52, LDL 58 Cr 1.46, eGFR 39   Physical Exam Vitals and nursing note reviewed.  Constitutional:      General: She is not in acute distress. Neck:     Vascular: No JVD.  Cardiovascular:     Rate and Rhythm: Normal rate and regular rhythm.     Pulses: Decreased pulses.     Heart sounds: Murmur heard.     Harsh midsystolic murmur is present with a grade of 2/6 at the upper right sternal border radiating to the neck.  Pulmonary:     Effort: Pulmonary effort is normal.     Breath sounds: Normal breath sounds. No wheezing or rales.  Musculoskeletal:     Right lower leg: No edema.     Left lower leg: No edema.      VISIT DIAGNOSES:   ICD-10-CM   1. PAD (peripheral artery disease)  I73.9 EKG 12-Lead    VAS US  ABI WITH/WO TBI    2. Bilateral carotid artery stenosis  I65.23 VAS US  CAROTID    3. Mixed hyperlipidemia  E78.2 EKG 12-Lead    4. Essential hypertension  I10 EKG 12-Lead    5. Murmur  R01.1 ECHOCARDIOGRAM COMPLETE       SILVA AAMODT is a 69 y.o. female with ypertension, hyperlipidemia, hypothyroidism, multiple sclerosis, CAD, PAD   Assessment & Plan Peripheral artery disease: Reduced distal pulses, but no resting leg pain or claudication, no  nonhealing wounds. Recommend ABI to establish new baseline. Continue aspirin  81 mg daily. Lipids are very well-controlled on statin, continue the same. Continue current antihypertensive therapy, including losartan .  Mixed hyperlipidemia: Previously was on Leqvio , but has been out of it for over a year now.  That said, LDL is very well-controlled on atorvastatin  40 mg daily, therefore continue the same.    Essential hypertension: Well-controlled on losartan  50 mg daily, continue the same.  Chronic kidney disease, stage 3 Stage 3 CKD with creatinine 1.46, stable since 2023. Recommend follow-up with  PCP.  Cardiac murmur: Ordered echocardiogram.  Bilateral carotid artery disease: Previously noted to be moderate, currently asymptomatic. Check carotid duplex ultrasound. Continue medical therapy.        F/u in 1 year  Signed, Newman JINNY Lawrence, MD  "

## 2025-01-15 ENCOUNTER — Encounter: Payer: Self-pay | Admitting: Cardiology

## 2025-01-15 DIAGNOSIS — R011 Cardiac murmur, unspecified: Secondary | ICD-10-CM | POA: Insufficient documentation

## 2025-01-17 ENCOUNTER — Telehealth: Payer: Self-pay | Admitting: Pharmacy Technician

## 2025-01-17 NOTE — Telephone Encounter (Signed)
 Auth Submission: APPROVED Site of care: Site of care: [facility] Payer: [employer] MEDICARE Medication & CPT/J Code(s) submitted: Leqvio  (Inclisiran) J1306 Diagnosis Code:  Route of submission (phone, fax, portal): LATENT Phone # Fax # Auth type: Buy/Bill PB Units/visits requested: 284MG  Q6 MONTHS X2 Reference number: [id number] Approval from: 01/14/25 to 12/22/25

## 2025-02-02 ENCOUNTER — Ambulatory Visit (HOSPITAL_COMMUNITY)

## 2025-02-18 ENCOUNTER — Ambulatory Visit (HOSPITAL_COMMUNITY)

## 2025-03-02 ENCOUNTER — Ambulatory Visit (HOSPITAL_COMMUNITY)
